# Patient Record
Sex: Female | Born: 1980 | Race: White | Hispanic: No | Marital: Single | Smoking: Never smoker
Health system: Southern US, Community
[De-identification: ages and names within clinical notes are randomized; demographics above are authoritative.]

## PROBLEM LIST (undated history)

## (undated) DIAGNOSIS — M545 Low back pain, unspecified: Secondary | ICD-10-CM

## (undated) DIAGNOSIS — Z8659 Personal history of other mental and behavioral disorders: Secondary | ICD-10-CM

## (undated) DIAGNOSIS — G4700 Insomnia, unspecified: Secondary | ICD-10-CM

## (undated) DIAGNOSIS — J45909 Unspecified asthma, uncomplicated: Secondary | ICD-10-CM

## (undated) DIAGNOSIS — Q2112 Patent foramen ovale: Secondary | ICD-10-CM

## (undated) DIAGNOSIS — R000 Tachycardia, unspecified: Secondary | ICD-10-CM

## (undated) DIAGNOSIS — J189 Pneumonia, unspecified organism: Secondary | ICD-10-CM

## (undated) DIAGNOSIS — G43909 Migraine, unspecified, not intractable, without status migrainosus: Secondary | ICD-10-CM

## (undated) DIAGNOSIS — N83209 Unspecified ovarian cyst, unspecified side: Secondary | ICD-10-CM

## (undated) DIAGNOSIS — E282 Polycystic ovarian syndrome: Secondary | ICD-10-CM

## (undated) DIAGNOSIS — Z8619 Personal history of other infectious and parasitic diseases: Secondary | ICD-10-CM

## (undated) DIAGNOSIS — E7800 Pure hypercholesterolemia, unspecified: Secondary | ICD-10-CM

## (undated) DIAGNOSIS — Q211 Atrial septal defect: Secondary | ICD-10-CM

## (undated) DIAGNOSIS — E559 Vitamin D deficiency, unspecified: Secondary | ICD-10-CM

## (undated) HISTORY — DX: Pneumonia, unspecified organism: J18.9

## (undated) HISTORY — DX: Low back pain: M54.5

## (undated) HISTORY — DX: Unspecified asthma, uncomplicated: J45.909

## (undated) HISTORY — DX: Insomnia, unspecified: G47.00

## (undated) HISTORY — DX: Vitamin D deficiency, unspecified: E55.9

## (undated) HISTORY — PX: WISDOM TOOTH EXTRACTION: SHX21

## (undated) HISTORY — DX: Low back pain, unspecified: M54.50

## (undated) HISTORY — DX: Pure hypercholesterolemia, unspecified: E78.00

## (undated) HISTORY — DX: Tachycardia, unspecified: R00.0

## (undated) HISTORY — DX: Personal history of other infectious and parasitic diseases: Z86.19

## (undated) HISTORY — PX: TONSILLECTOMY: SUR1361

## (undated) HISTORY — DX: Personal history of other mental and behavioral disorders: Z86.59

---
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
## 1999-11-12 ENCOUNTER — Other Ambulatory Visit: Admission: RE | Admit: 1999-11-12 | Discharge: 1999-11-12 | Payer: Self-pay | Admitting: Internal Medicine

## 2000-07-03 ENCOUNTER — Encounter: Payer: Self-pay | Admitting: Internal Medicine

## 2000-07-03 ENCOUNTER — Ambulatory Visit (HOSPITAL_COMMUNITY): Admission: RE | Admit: 2000-07-03 | Discharge: 2000-07-03 | Payer: Self-pay | Admitting: Internal Medicine

## 2005-03-21 ENCOUNTER — Emergency Department (HOSPITAL_COMMUNITY): Admission: EM | Admit: 2005-03-21 | Discharge: 2005-03-21 | Payer: Self-pay | Admitting: *Deleted

## 2005-05-31 DIAGNOSIS — J189 Pneumonia, unspecified organism: Secondary | ICD-10-CM

## 2005-05-31 HISTORY — DX: Pneumonia, unspecified organism: J18.9

## 2008-02-03 ENCOUNTER — Emergency Department (HOSPITAL_COMMUNITY): Admission: EM | Admit: 2008-02-03 | Discharge: 2008-02-03 | Payer: Self-pay | Admitting: Emergency Medicine

## 2009-01-05 ENCOUNTER — Inpatient Hospital Stay (HOSPITAL_COMMUNITY): Admission: EM | Admit: 2009-01-05 | Discharge: 2009-01-08 | Payer: Self-pay | Admitting: Emergency Medicine

## 2009-08-04 ENCOUNTER — Ambulatory Visit (HOSPITAL_COMMUNITY): Admission: RE | Admit: 2009-08-04 | Discharge: 2009-08-04 | Payer: Self-pay | Admitting: Cardiology

## 2009-08-04 ENCOUNTER — Encounter (INDEPENDENT_AMBULATORY_CARE_PROVIDER_SITE_OTHER): Payer: Self-pay | Admitting: Cardiology

## 2010-10-07 LAB — CBC
HCT: 33.8 % — ABNORMAL LOW (ref 36.0–46.0)
HCT: 39.1 % (ref 36.0–46.0)
Hemoglobin: 11.4 g/dL — ABNORMAL LOW (ref 12.0–15.0)
Hemoglobin: 13.1 g/dL (ref 12.0–15.0)
MCHC: 33.7 g/dL (ref 30.0–36.0)
MCHC: 33.9 g/dL (ref 30.0–36.0)
MCV: 88.1 fL (ref 78.0–100.0)
MCV: 88.6 fL (ref 78.0–100.0)
MCV: 88.6 fL (ref 78.0–100.0)
Platelets: 274 10*3/uL (ref 150–400)
Platelets: 279 10*3/uL (ref 150–400)
Platelets: 346 10*3/uL (ref 150–400)
RBC: 4.44 MIL/uL (ref 3.87–5.11)
RDW: 13.3 % (ref 11.5–15.5)
RDW: 13.4 % (ref 11.5–15.5)
WBC: 8.5 10*3/uL (ref 4.0–10.5)
WBC: 9.1 10*3/uL (ref 4.0–10.5)
WBC: 9.6 10*3/uL (ref 4.0–10.5)

## 2010-10-07 LAB — DRUGS OF ABUSE SCREEN W/O ALC, ROUTINE URINE
Amphetamine Screen, Ur: NEGATIVE
Cocaine Metabolites: NEGATIVE
Opiate Screen, Urine: NEGATIVE
Phencyclidine (PCP): NEGATIVE
Propoxyphene: NEGATIVE

## 2010-10-07 LAB — URINALYSIS, ROUTINE W REFLEX MICROSCOPIC
Bilirubin Urine: NEGATIVE
Glucose, UA: NEGATIVE mg/dL
Hgb urine dipstick: NEGATIVE
Ketones, ur: NEGATIVE mg/dL
Nitrite: NEGATIVE
Protein, ur: NEGATIVE mg/dL
Specific Gravity, Urine: 1.027 (ref 1.005–1.030)
Urobilinogen, UA: 0.2 mg/dL (ref 0.0–1.0)
pH: 6 (ref 5.0–8.0)

## 2010-10-07 LAB — BASIC METABOLIC PANEL
BUN: 10 mg/dL (ref 6–23)
BUN: 11 mg/dL (ref 6–23)
BUN: 11 mg/dL (ref 6–23)
BUN: 19 mg/dL (ref 6–23)
CO2: 24 mEq/L (ref 19–32)
Calcium: 8.7 mg/dL (ref 8.4–10.5)
Calcium: 8.8 mg/dL (ref 8.4–10.5)
Chloride: 110 mEq/L (ref 96–112)
Chloride: 111 mEq/L (ref 96–112)
Chloride: 111 mEq/L (ref 96–112)
Creatinine, Ser: 0.83 mg/dL (ref 0.4–1.2)
Creatinine, Ser: 0.85 mg/dL (ref 0.4–1.2)
Creatinine, Ser: 0.92 mg/dL (ref 0.4–1.2)
Creatinine, Ser: 1.1 mg/dL (ref 0.4–1.2)
GFR calc non Af Amer: >60 mL/min (ref >60)
Glucose, Bld: 94 mg/dL (ref 70–99)
Sodium: 139 mEq/L (ref 135–145)

## 2010-10-07 LAB — DIFFERENTIAL
Eosinophils Relative: 0 % (ref 0–5)
Lymphocytes Relative: 37 % (ref 12–46)
Lymphs Abs: 3.1 10*3/uL (ref 0.7–4.0)
Monocytes Absolute: 0.5 10*3/uL (ref 0.1–1.0)
Monocytes Relative: 7 % (ref 3–12)

## 2010-10-07 LAB — POCT I-STAT, CHEM 8
BUN: 20 mg/dL (ref 6–23)
Calcium, Ion: 1.17 mmol/L (ref 1.12–1.32)
Hemoglobin: 13.3 g/dL (ref 12.0–15.0)
TCO2: 25 mmol/L (ref 0–100)

## 2010-10-07 LAB — CORTISOL
Cortisol, Plasma: 13.2 ug/dL
Cortisol, Plasma: 3.7 ug/dL
Cortisol, Plasma: 6.6 ug/dL

## 2010-10-07 LAB — LIPID PANEL
HDL: 39 mg/dL — ABNORMAL LOW (ref >39)
Total CHOL/HDL Ratio: 4.9 RATIO

## 2010-10-07 LAB — D-DIMER, QUANTITATIVE: D-Dimer, Quant: 0.35 ug/mL-FEU (ref 0.00–0.48)

## 2010-10-07 LAB — CARDIAC PANEL(CRET KIN+CKTOT+MB+TROPI)
Relative Index: 0.4 (ref 0.0–2.5)
Total CK: 103 U/L (ref 7–177)
Troponin I: 0.01 ng/mL (ref 0.00–0.06)

## 2010-10-07 LAB — POCT CARDIAC MARKERS
CKMB, poc: <1 ng/mL — ABNORMAL LOW (ref 1.0–8.0)
Myoglobin, poc: 60 ng/mL (ref 12–200)
Troponin i, poc: <0.05 ng/mL (ref 0.00–0.09)

## 2010-10-07 LAB — TSH: TSH: 2.356 u[IU]/mL (ref 0.350–4.500)

## 2010-10-07 LAB — MAGNESIUM: Magnesium: 2.1 mg/dL (ref 1.5–2.5)

## 2010-10-07 LAB — POCT PREGNANCY, URINE: Preg Test, Ur: NEGATIVE

## 2010-11-13 NOTE — Discharge Summary (Signed)
Kathleen Montoya, COWING NO.:  [id number]   MEDICAL RECORD NO.:  [id number]          PATIENT TYPE:  OBV   LOCATION:  1439                         FACILITY:  [facility]   PHYSICIAN:  Ramiro Harvest, MD    DATE OF BIRTH:  1981/05/29   DATE OF ADMISSION:  01/04/2009  DATE OF DISCHARGE:  01/08/2009                               DISCHARGE SUMMARY   PRIMARY CARE PHYSICIAN:  Dr. Laurann Montoya of [facility].   DISCHARGE DIAGNOSES:  1. Hypotension.  2. Sinus tachycardia.  3. Dehydration.  4. Polycystic ovarian syndrome.  5. Migraine headaches.  6. History of depression.  7. History of low back pain.   DISCHARGE MEDICATIONS:  1. Wellbutrin 300 mg XL p.o. daily.  2. Metformin 500 mg p.o. daily.  3. Multivitamin 1 tablet p.o. daily.  4. Soma as previously taken.  5. Midrin as previously taken p.r.n. for headache.  6. Phenergan as previously taken p.r.n.  7. Zomig 5 mg p.o. p.r.n. headache.   DISPOSITION AND FOLLOW-UP:  Patient will be discharged home.  Patient is  to follow up with PCP in 1 week.  On followup, patient's blood pressure  needs to be reassessed.   CONSULTATIONS DONE:  None.   PROCEDURES PERFORMED:  1. A chest x-ray was done on January 04, 2009, which showed no acute      abnormalities.  2. An MRI of the head was done on January 07, 2009, which was negative.   BRIEF ADMISSION HISTORY AND PHYSICAL:  [name] is a 30-  year-old Caucasian female with no significant past medical history who  presented to the ED with complaints of palpitations that started early  on in the morning.  Patient had some occasional lightheadedness  associated with this but denied having any chest pain or shortness of  breath on exertion or any syncopal episodes.  Patient did not have any  similar episodes in the past.  Patient's orthostatics were checked and  she was reportedly normal according to the ED.  Patient was given 3 L  normal saline to replete her volume  status and even after that her heart  rate continued to be in the 130s therefore the physician's assistant  wanted the patient to be admitted given the uncertain etiology of her  sinus tachycardia.  Patient had been afebrile throughout and had no  other complaints.  Patient denied having any chest pain.  There was no  sharp chest pain with deep breaths either.  Patient had not had any  recent travel and was not on any birth control pills.  Patient had  stopped her birth control pills 2 months prior to admission.  Patient  denied any tobacco abuse.  Risk factors for DVT and PE were very low  probability.  D-dimer which was obtained was pending at the time of  admission.  EKG which was obtained revealed a sinus tachycardia without  any other abnormalities.  One set of cardiac enzymes done in the ED was  negative.   PHYSICAL EXAM PER ADMITTING PHYSICIAN:  Temperature of 99.1.  Pulse of  124 to 132.  Blood  pressure 105/54.  Saturating 100% on room air.  GENERAL:  Patient in no acute distress.  HEENT:  Normocephalic, atraumatic.  Pupils equal, round, and reactive to  light and accommodation.  Extraocular movements intact.  Oropharynx was  clear.  No lesions.  No exudates.  NECK:  Supple.  No lymphadenopathy.  Moist mucous membranes.  LUNGS:  Clear to auscultation bilaterally.  CARDIOVASCULAR:  Regular rate and rhythm.  No murmurs, rubs, or gallops.  ABDOMEN:  Soft, nontender, nondistended.  Positive bowel sounds.  No  hepatosplenomegaly.  EXTREMITIES:  No clubbing, cyanosis, or edema.  NEUROLOGIC:  Patient was alert and oriented x3.  Cranial nerves II-XII  are grossly intact and no focal deficits.   ADMISSION LABS:  CBC, white count of 9.6, hemoglobin of 13.3, hematocrit  39, platelets 346.  Her D-dimer was 0.35.  Sodium of 138, potassium 3.6,  chloride 103, bicarb 25, BUN 20, creatinine 1.3.  CK-MB was less than 1.  Troponin was less than 0.05.  Urine pregnancy test was negative.   Urinalysis was within normal limits.   HOSPITAL COURSE:  1. Hypotension.  During this hospitalization, patient was noted to be      hypotensive and initially felt to be likely secondary to volume      depletion.  Patient's blood pressure went as low as 88/51.  Patient      was hydrated with IV fluids with a slow improvement in her blood      pressure.  Patient's blood pressure remained during the      hospitalization in the low 90s and with IV fluids, as well as IV      boluses came up to the low 100s.  Patient had a few complaints of      some lightheadedness initially which resolved.  Cardiac enzymes      which were obtained were negative.  A TSH which was obtained was      within normal limits.  Patient's hemoglobin remained stable.      Patient did not have any signs of gross GI bleed and her last      hemoglobin was 12.2.  Patient had a normal white count, was      afebrile, did not have any signs of sepsis or infection.  Patient      was hydrated with IV fluids.  A random cortisol level was obtained      which came back at 6.6 which was on the low end of normal and as      such a cosyntropin stem test was done which was essentially      negative.  Patient was asymptomatic on the day prior to discharge      but during the hospitalization had some transient complaints of a      right-sided headache and some lightheadedness and fatigue and was      concerned of any neurological problems.  Patient was assured she      would not have any neurological deficits, however, was concerned      and had requested MRI to be done.  MRI was done which came back      negative with results as stated above.  On the day of discharge,      patient was asymptomatic and was in stable and improved condition.      Patient will be discharged home in stable and improved condition      with a close followup with her PCP.  On day  of discharge, vital      signs, temperature 98.5, pulse of 73, respirations of  14, she had a      blood pressure of 103/59, saturating 98% on room air.  Patient will      be discharged in stable condition with close followup with PCP as      an outpatient.  A D-dimer which was obtained during the      hospitalization was 0.35 which was within normal limits.  Patient      did not have any chest pain and her pretest probability for      pulmonary embolism was low.  2. Sinus tachycardia.  Patient was admitted with sinus tachycardia and      with heart rates in the 130s.  Patient was placed on telemetry tele      bed and hydrated with IV fluids as it was thought patient was      likely volume depleted .  Cardiac enzymes which were cycled were      negative.  A TSH which was obtained was within normal limits at      2.356.  Patient was hydrated with IV fluids with resolution of her      sinus tachycardia.  EKG which was done just showed a sinus      tachycardia with no ST-T wave abnormalities.  Patient will be      discharged in stable and improved condition.  3. Dehydration.  On admission, patient was noted to be slightly      dehydrated with a creatinine of 1.3 and a urinalysis with a      specific gravity of 1.027.  Patient was adequately hydrated with IV      fluids and her dehydration was resolved by day of discharge.      Patient will be discharged in stable and improved condition.   The rest of patient's chronic medical issues were stable throughout the  hospitalization and patient will be discharged in stable condition.  On  day of discharge, vital signs, temperature 98.5, pulse of 73, blood  pressure of 103/59, respirations 14, saturating 98% on room air.   DISCHARGE LABS:  Sodium 139, potassium 3.9, chloride 112, bicarb 24, BUN  11, creatinine 0.83, and glucose of 94 with a calcium of 8.8.   It was a pleasure taking care of [name].      Ramiro Harvest, MD  Electronically Signed     DT/MEDQ  D:  01/08/2009  T:  01/08/2009  Job:   [id number]   cc:   Stacie Acres. Cliffton Asters, M.D.  Fax: [phone]

## 2010-11-13 NOTE — H&P (Signed)
NAMECRUCITA, Kathleen Montoya           ACCOUNT NO.:  [id number]   MEDICAL RECORD NO.:  [id number]          PATIENT TYPE:  EMS   LOCATION:  ED                           FACILITY:  [facility]   PHYSICIAN:  Lucile Crater, MD         DATE OF BIRTH:  1981/02/06   DATE OF ADMISSION:  01/04/2009  DATE OF DISCHARGE:                              HISTORY & PHYSICAL   PRIMARY CARE PHYSICIAN:  Stacie Acres. White, M.D.   CHIEF COMPLAINT:  Palpitations.   HISTORY OF PRESENT ILLNESS:  Patient is a 30 year old Caucasian female  with no significant past medical history.  She comes to the ER  complaining of palpitations which started earlier this morning.  Patient  had occasional lightheadedness associated with this but denied having  any chest pain or dyspnea on exertion or any syncopal episodes.  The  patient did not have similar episodes in the past.  The patient's  orthostatics were checked, and they were reportedly normal, according to  the ER.  The patient was given 3 liters of normal saline to repeat her  volume status, and even after that, her heart rate continued to be in  the 130's.  Therefore, the PA wanted the patient to be admitted, given  the uncertain etiology of the sinus tachycardia.  The patient has been  afebrile throughout and has no other complaints.  She denies having any  chest pain.  There is no sharp chest pain with deep breaths.  She did  not travel anywhere recently.  She has not taken any birth control pills  currently.  She stopped them 2 months ago.  She does not smoke.  Her  risk factors for DVT and pulmonary embolus is very less.  A D-dimer was  ordered.  Results are not back yet.  An EKG was obtained in the  emergency room, which revealed a sinus tachycardia without any other  abnormalities.  One set of cardiac enzymes was negative.   REVIEW OF SYSTEMS:  A complete review of systems was done, which  included general, HEENT, cardiovascular, respiratory, GI, GU, endocrine,  musculoskeletal, skin, neurologic, psychiatric, and all were within  normal limits except for what was mentioned above.   PAST MEDICAL HISTORY:  1. Polycystic ovarian syndrome.  2. Migraine headaches.  3. Low back pain.   ALLERGIES:  1. HYDROCODONE, which gives her a rash.  2. ERYTHROMYCIN, which gives her a rash.   SOCIAL HISTORY:  There is no history of smoking or illicit drug use.  She occasionally drinks alcohol.  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   FAMILY HISTORY:  Cancer, diabetes, and CVA.   PHYSICAL EXAMINATION:  VITALS:  Pulse rate 124 to 132, blood pressure  105/54.  Temp 99.1, pulse ox 100% on room air.  GENERAL:  Patient is not in any acute distress.  Alert and oriented x3.  Afebrile.  HEENT:  Normocephalic and atraumatic.  Pupils are equal, round and  reactive to light and accommodation.  Extraocular movements are intact.  Mucous membranes are moist.  NECK:  Supple.  No JVD.  No lymphadenopathy.  CARDIOVASCULAR:  Regular rate and rhythm.  Rate is normal.  No murmurs,  rubs or gallops.  LUNGS:  Clear to auscultation bilaterally.  ABDOMEN:  Soft, nondistended, nontender.  No hepatosplenomegaly or  palpable masses.  EXTREMITIES:  No clubbing, cyanosis or edema.  NEUROLOGIC:  Grossly nonfocal.   LABS/STUDIES:  WBC 9600, hemoglobin 13.3, hematocrit 39, platelets 346.  Normal differential.  D-dimer 0.35.  Sodium 138, potassium 3.6, chloride  103, bicarb 25, BUN 20, creatinine 1.3.  CK-MB less than 1.  Troponin  less than 0.05.  Urine pregnancy test is negative.  Urinalysis is within  normal limits.   ASSESSMENT/PLAN:  1. Sinus tachycardia, no obvious etiology at this time.  Multiple      possibilities, including hyperthyroidism, hypovolemia, anemia.      Will check a TSH on the patient.  Will replete her volume.  She      received 2 units so far.  Will continue normal saline at a rate of      125 ml/hr.  Will place her on telemetry to see  if there is any      aberrance in her rhythm.  2. Polycystic ovarian syndrome.  Her creatinine is 1.3.  Will hold off      on Metformin until there is an improvement in serum creatinine.  3. Migraine headaches:  Currently asymptomatic.  Will start her on      medications as needed.  4. Deep venous thrombosis prophylaxis with subcutaneous heparin.  5. Fluids, electrolytes, nutrition:  Will replenish her volume status.      Will replace electrolytes as needed.  Will start her on a regular      diet.   DISPOSITION:  Will admit patient for 23-hour observation.      Lucile Crater, MD  Electronically Signed     TA/MEDQ  D:  01/05/2009  T:  01/05/2009  Job:  [id number]

## 2011-04-30 ENCOUNTER — Ambulatory Visit (HOSPITAL_COMMUNITY)
Admission: RE | Admit: 2011-04-30 | Discharge: 2011-04-30 | Disposition: A | Discharge status: Home / Self Care | Payer: 59 | Source: Ambulatory Visit | Attending: Gastroenterology | Admitting: Gastroenterology

## 2011-04-30 DIAGNOSIS — Z79899 Other long term (current) drug therapy: Secondary | ICD-10-CM | POA: Insufficient documentation

## 2011-04-30 DIAGNOSIS — G4700 Insomnia, unspecified: Secondary | ICD-10-CM | POA: Insufficient documentation

## 2011-04-30 DIAGNOSIS — G43909 Migraine, unspecified, not intractable, without status migrainosus: Secondary | ICD-10-CM | POA: Insufficient documentation

## 2011-04-30 DIAGNOSIS — R1310 Dysphagia, unspecified: Secondary | ICD-10-CM | POA: Insufficient documentation

## 2011-04-30 DIAGNOSIS — J45909 Unspecified asthma, uncomplicated: Secondary | ICD-10-CM | POA: Insufficient documentation

## 2011-04-30 DIAGNOSIS — Q211 Atrial septal defect: Secondary | ICD-10-CM | POA: Insufficient documentation

## 2011-04-30 DIAGNOSIS — K219 Gastro-esophageal reflux disease without esophagitis: Secondary | ICD-10-CM | POA: Insufficient documentation

## 2011-04-30 DIAGNOSIS — E282 Polycystic ovarian syndrome: Secondary | ICD-10-CM | POA: Insufficient documentation

## 2011-04-30 DIAGNOSIS — E7800 Pure hypercholesterolemia, unspecified: Secondary | ICD-10-CM | POA: Insufficient documentation

## 2011-04-30 DIAGNOSIS — E559 Vitamin D deficiency, unspecified: Secondary | ICD-10-CM | POA: Insufficient documentation

## 2011-04-30 DIAGNOSIS — Q2111 Secundum atrial septal defect: Secondary | ICD-10-CM | POA: Insufficient documentation

## 2011-04-30 DIAGNOSIS — Z7982 Long term (current) use of aspirin: Secondary | ICD-10-CM | POA: Insufficient documentation

## 2011-06-29 ENCOUNTER — Ambulatory Visit: Payer: 59

## 2011-06-29 DIAGNOSIS — G43009 Migraine without aura, not intractable, without status migrainosus: Secondary | ICD-10-CM

## 2012-04-03 ENCOUNTER — Encounter: Payer: 59 | Admitting: Physician Assistant

## 2012-04-03 NOTE — Progress Notes (Signed)
This encounter was created in error - please disregard.

## 2013-01-31 ENCOUNTER — Emergency Department (HOSPITAL_BASED_OUTPATIENT_CLINIC_OR_DEPARTMENT_OTHER): Payer: 59

## 2013-01-31 ENCOUNTER — Emergency Department (HOSPITAL_BASED_OUTPATIENT_CLINIC_OR_DEPARTMENT_OTHER)
Admission: EM | Admit: 2013-01-31 | Discharge: 2013-01-31 | Disposition: A | Discharge status: Home / Self Care | Payer: 59 | Attending: Emergency Medicine | Admitting: Emergency Medicine

## 2013-01-31 ENCOUNTER — Encounter (HOSPITAL_BASED_OUTPATIENT_CLINIC_OR_DEPARTMENT_OTHER): Payer: Self-pay | Admitting: *Deleted

## 2013-01-31 DIAGNOSIS — Z8639 Personal history of other endocrine, nutritional and metabolic disease: Secondary | ICD-10-CM | POA: Insufficient documentation

## 2013-01-31 DIAGNOSIS — R110 Nausea: Secondary | ICD-10-CM | POA: Insufficient documentation

## 2013-01-31 DIAGNOSIS — Z79899 Other long term (current) drug therapy: Secondary | ICD-10-CM | POA: Insufficient documentation

## 2013-01-31 DIAGNOSIS — Z862 Personal history of diseases of the blood and blood-forming organs and certain disorders involving the immune mechanism: Secondary | ICD-10-CM | POA: Insufficient documentation

## 2013-01-31 DIAGNOSIS — Z3202 Encounter for pregnancy test, result negative: Secondary | ICD-10-CM | POA: Insufficient documentation

## 2013-01-31 DIAGNOSIS — Z87798 Personal history of other (corrected) congenital malformations: Secondary | ICD-10-CM | POA: Insufficient documentation

## 2013-01-31 DIAGNOSIS — Z8742 Personal history of other diseases of the female genital tract: Secondary | ICD-10-CM | POA: Insufficient documentation

## 2013-01-31 DIAGNOSIS — G43909 Migraine, unspecified, not intractable, without status migrainosus: Secondary | ICD-10-CM | POA: Insufficient documentation

## 2013-01-31 DIAGNOSIS — R109 Unspecified abdominal pain: Secondary | ICD-10-CM | POA: Insufficient documentation

## 2013-01-31 HISTORY — DX: Patent foramen ovale: Q21.12

## 2013-01-31 HISTORY — DX: Migraine, unspecified, not intractable, without status migrainosus: G43.909

## 2013-01-31 HISTORY — DX: Atrial septal defect: Q21.1

## 2013-01-31 HISTORY — DX: Unspecified ovarian cyst, unspecified side: N83.209

## 2013-01-31 HISTORY — DX: Polycystic ovarian syndrome: E28.2

## 2013-01-31 LAB — CBC WITH DIFFERENTIAL/PLATELET
Eosinophils Absolute: 0.1 10*3/uL (ref 0.0–0.7)
Eosinophils Relative: 1 % (ref 0–5)
HCT: 40 % (ref 36.0–46.0)
Hemoglobin: 13.3 g/dL (ref 12.0–15.0)
Lymphocytes Relative: 30 % (ref 12–46)
Lymphs Abs: 2.4 10*3/uL (ref 0.7–4.0)
MCH: 29 pg (ref 26.0–34.0)
MCV: 87.3 fL (ref 78.0–100.0)
Monocytes Absolute: 0.5 10*3/uL (ref 0.1–1.0)
Monocytes Relative: 6 % (ref 3–12)
RBC: 4.58 MIL/uL (ref 3.87–5.11)
WBC: 7.8 10*3/uL (ref 4.0–10.5)

## 2013-01-31 LAB — COMPREHENSIVE METABOLIC PANEL
ALT: 19 U/L (ref 0–35)
Alkaline Phosphatase: 74 U/L (ref 39–117)
BUN: 13 mg/dL (ref 6–23)
CO2: 26 mEq/L (ref 19–32)
Calcium: 9.8 mg/dL (ref 8.4–10.5)
GFR calc Af Amer: 85 mL/min — ABNORMAL LOW (ref >90)
GFR calc non Af Amer: 74 mL/min — ABNORMAL LOW (ref >90)
Glucose, Bld: 89 mg/dL (ref 70–99)
Total Protein: 7.8 g/dL (ref 6.0–8.3)

## 2013-01-31 LAB — URINALYSIS, ROUTINE W REFLEX MICROSCOPIC
Ketones, ur: NEGATIVE mg/dL
Nitrite: NEGATIVE
Protein, ur: NEGATIVE mg/dL

## 2013-01-31 LAB — LIPASE, BLOOD: Lipase: 43 U/L (ref 11–59)

## 2013-01-31 LAB — PREGNANCY, URINE: Preg Test, Ur: NEGATIVE

## 2013-01-31 MED ORDER — SUCRALFATE 1 G PO TABS: 1.0000 g | ORAL_TABLET | Freq: Four times a day (QID) | ORAL | Status: DC

## 2013-01-31 MED ORDER — IOHEXOL 300 MG/ML  SOLN
50.0000 mL | Freq: Once | INTRAMUSCULAR | Status: AC | PRN
  Administered 2013-01-31: 50 mL via ORAL

## 2013-01-31 MED ORDER — IOHEXOL 300 MG/ML  SOLN
100.0000 mL | Freq: Once | INTRAMUSCULAR | Status: AC | PRN
  Administered 2013-01-31: 100 mL via INTRAVENOUS

## 2013-01-31 MED ORDER — HYDROCODONE-ACETAMINOPHEN 5-325 MG PO TABS: 1.0000 | ORAL_TABLET | Freq: Three times a day (TID) | ORAL | Status: DC | PRN

## 2013-01-31 MED ORDER — ONDANSETRON HCL 4 MG/2ML IJ SOLN
4.0000 mg | Freq: Once | INTRAMUSCULAR | Status: AC
  Administered 2013-01-31: 4 mg via INTRAVENOUS
  Filled 2013-01-31: qty 2

## 2013-01-31 MED ORDER — SODIUM CHLORIDE 0.9 % IV SOLN
1000.0000 mL | Freq: Once | INTRAVENOUS | Status: AC
  Administered 2013-01-31: 1000 mL via INTRAVENOUS

## 2013-01-31 NOTE — ED Notes (Signed)
Pt got refer here from [facility] walk-in clinic for evaluation of possible gallbladder or pancreas abnormalities. C/o upper abd pain since Wednesday; radiates to the shoulders. ABC intact, denied dysuria, denied any abnormality with bowel movement. Bowel sound present, non-tender on palpation. Thoughts coherent.

## 2013-01-31 NOTE — ED Notes (Signed)
Pt reports upper midline, right abdominal pain that started on Wednesday. (Pt was sent here from [facility])

## 2013-01-31 NOTE — ED Provider Notes (Signed)
CSN: [id number]     Arrival date & time 01/31/13  1539 History  This chart was scribed for Kathleen Munch, MD by Ardelia Mems, ED Scribe. This patient was seen in room MH07/MH07 and the patient's care was started at 4:11 PM.   First MD Initiated Contact with Patient 01/31/13 1611     Chief Complaint  Patient presents with  . Abdominal Pain    The history is provided by the patient. No language interpreter was used.   HPI Comments: Kathleen Montoya is a 32 y.o. Female with a history of polycystic ovarian disease and patent foramen ovale who presents to the Emergency Department complaining of gradual onset, gradually worsening, constant, moderate epigastric and upper abdominal pain that radiates to her shoulder blades onset 4 days ago. She states that she was sent here by her PCP, Dr. Laurann Montana with [facility] today. Pt states that she has a mass near her umbilicus that she believes and her PCP suspects may be an umbilical hernia, however she wants to rule out gallbladder and pancreas abnormalities. She states that her abdominal pain is worsened with eating and with lying supine. She reports associated nausea, which she believes is due to the fact that she hasn't eaten today. She denies history of similar abdominal pain. She denies vomiting, diarrhea, constipation, fever, chills, chest pain, SOB, confusion, weakness, syncope, dysuria or any other symptoms. She denies any history of smoking and is an occasional alcohol user.  PCP- Dr. Laurann Montana  Past Medical History  Diagnosis Date  . Ovarian cyst   . Migraines   . Patent foramen ovale   . Polycystic ovarian disease    Past Surgical History  Procedure Laterality Date  . Tonsillectomy    . Wisdom tooth extraction     History reviewed. No pertinent family history. History  Substance Use Topics  . Smoking status: Never Smoker   . Smokeless tobacco: Not on file  . Alcohol Use: Yes   OB History   Grav Para Term  Preterm Abortions TAB SAB Ect Mult Living                 Review of Systems  Constitutional: Negative for fever and chills.       Per HPI, otherwise negative  HENT:       Per HPI, otherwise negative  Respiratory: Negative for shortness of breath.        Per HPI, otherwise negative  Cardiovascular: Negative for chest pain.       Per HPI, otherwise negative  Gastrointestinal: Positive for nausea and abdominal pain. Negative for vomiting, diarrhea and constipation.  Endocrine:       Negative aside from HPI  Genitourinary: Negative for dysuria.       Neg aside from HPI   Musculoskeletal:       Per HPI, otherwise negative  Skin: Negative.   Neurological: Negative for syncope and weakness.  Psychiatric/Behavioral: Negative for confusion.  All other systems reviewed and are negative.    Allergies  Other  Home Medications   Current Outpatient Rx  Name  Route  Sig  Dispense  Refill  . cholecalciferol (VITAMIN D) 1000 UNITS tablet   Oral   Take 1,000 Units by mouth daily.         . cyclobenzaprine (FLEXERIL) 5 MG tablet   Oral   Take 5 mg by mouth 3 (three) times daily as needed for muscle spasms.         Marland Kitchen  HYDROcodone-acetaminophen (NORCO/VICODIN) 5-325 MG per tablet   Oral   Take 1 tablet by mouth every 6 (six) hours as needed for pain.         . metFORMIN (GLUCOPHAGE) 500 MG tablet   Oral   Take 500 mg by mouth daily with breakfast.         . metoprolol tartrate (LOPRESSOR) 25 MG tablet   Oral   Take 25 mg by mouth 2 (two) times daily.         . Multiple Vitamins-Minerals (MULTIVITAMIN WITH MINERALS) tablet   Oral   Take 1 tablet by mouth daily.         Marland Kitchen omeprazole (PRILOSEC) 10 MG capsule   Oral   Take 10 mg by mouth daily.         . promethazine (PHENERGAN) 25 MG tablet   Oral   Take 25 mg by mouth every 6 (six) hours as needed for nausea.         . SUMAtriptan (IMITREX) 6 MG/0.5ML SOLN injection   Subcutaneous   Inject 6 mg into the  skin every 2 (two) hours as needed for migraine or headache. F          Triage Vitals: BP 116/80  Pulse 95  Temp(Src) 98.4 F (36.9 C) (Oral)  Resp 18  Ht 5' 9.5" (1.765 m)  Wt 310 lb (140.615 kg)  BMI 45.14 kg/m2  SpO2 99%  Physical Exam  Nursing note and vitals reviewed. Constitutional: She is oriented to person, place, and time. She appears well-developed and well-nourished. No distress.  HENT:  Head: Normocephalic and atraumatic.  Eyes: Conjunctivae and EOM are normal.  Cardiovascular: Normal rate and regular rhythm.   Pulmonary/Chest: Effort normal and breath sounds normal. No stridor. No respiratory distress.  Abdominal: She exhibits no distension.  Mild tenderness to palpation in epigastric area. No tenderness to palpation in lower abdomen. No Murphy's sign.  Musculoskeletal: She exhibits no edema.  Neurological: She is alert and oriented to person, place, and time. No cranial nerve deficit.  Skin: Skin is warm and dry.  Psychiatric: She has a normal mood and affect.    ED Course   Medications  0.9 %  sodium chloride infusion (1,000 mLs Intravenous New Bag/Given 01/31/13 1654)  ondansetron (ZOFRAN) injection 4 mg (4 mg Intravenous Given 01/31/13 1654)  iohexol (OMNIPAQUE) 300 MG/ML solution 50 mL (50 mLs Oral Contrast Given 01/31/13 1739)  iohexol (OMNIPAQUE) 300 MG/ML solution 100 mL (100 mLs Intravenous Contrast Given 01/31/13 1740)   Procedures (including critical care time)  DIAGNOSTIC STUDIES: Oxygen Saturation is 99% on RA, normal by my interpretation.    COORDINATION OF CARE: 4:38 PM- Pt advised of plan for diagnostic lab work and radiology, along with plan for symptomatic relief with IV fluids and Zofran in the ED and pt agrees.  Labs Reviewed  COMPREHENSIVE METABOLIC PANEL - Abnormal; Notable for the following:    GFR calc non Af Amer 74 (*)    GFR calc Af Amer 85 (*)    All other components within normal limits  PREGNANCY, URINE  URINALYSIS, ROUTINE W  REFLEX MICROSCOPIC  CBC WITH DIFFERENTIAL  LIPASE, BLOOD   No results found.  No diagnosis found.   6:31 PM Patient ambulatory, in no distress MDM   I personally performed the services described in this documentation, which was scribed in my presence. The recorded information has been reviewed and is accurate.   Patient presents with concern of abdominal pain.  So the patient has a history of GERD, she now describes pain about the superior epigastrium, right upper quadrant, raising concern for both entrapped hernia and cholecystitis.  Patient's lab x-rays, CT are largely reassuring.  The patient remained hemodynamically stable in no distress, ambulatory during her emergency department stay.  She was discharged in stable condition with instructions to follow up with her primary care physician   Kathleen Munch, MD 01/31/13 [phone]

## 2013-04-03 ENCOUNTER — Other Ambulatory Visit: Payer: Self-pay | Admitting: Neurology

## 2013-09-21 ENCOUNTER — Encounter: Payer: Self-pay | Admitting: Cardiology

## 2013-11-08 ENCOUNTER — Ambulatory Visit: Payer: 59 | Admitting: Cardiology

## 2013-11-12 ENCOUNTER — Ambulatory Visit: Payer: 59 | Admitting: Cardiology

## 2014-03-15 ENCOUNTER — Other Ambulatory Visit: Payer: Self-pay | Admitting: *Deleted

## 2014-03-15 ENCOUNTER — Telehealth: Payer: Self-pay | Admitting: *Deleted

## 2014-03-15 MED ORDER — METOPROLOL TARTRATE 25 MG PO TABS: 25.0000 mg | ORAL_TABLET | Freq: Two times a day (BID) | ORAL | Status: DC

## 2014-03-15 NOTE — Telephone Encounter (Signed)
Ok to refill metoprolol 25 mg 1 tab BID for 30 day supply. Pt needs appt asap!

## 2014-03-15 NOTE — Telephone Encounter (Signed)
[facility] requests metoprolol refill for patient. Thanks, MI

## 2014-04-20 ENCOUNTER — Other Ambulatory Visit: Payer: Self-pay | Admitting: *Deleted

## 2014-04-20 MED ORDER — METOPROLOL TARTRATE 25 MG PO TABS: 25.0000 mg | ORAL_TABLET | Freq: Two times a day (BID) | ORAL | Status: DC

## 2014-06-01 ENCOUNTER — Encounter: Payer: Self-pay | Admitting: *Deleted

## 2014-06-03 ENCOUNTER — Ambulatory Visit (INDEPENDENT_AMBULATORY_CARE_PROVIDER_SITE_OTHER): Payer: 59 | Admitting: Cardiology

## 2014-06-03 ENCOUNTER — Encounter: Payer: Self-pay | Admitting: Cardiology

## 2014-06-03 VITALS — BP 120/60 | HR 73 | Ht 69.0 in | Wt 320.4 lb

## 2014-06-03 DIAGNOSIS — R002 Palpitations: Secondary | ICD-10-CM

## 2014-06-03 DIAGNOSIS — R0789 Other chest pain: Secondary | ICD-10-CM

## 2014-06-03 NOTE — Patient Instructions (Signed)
The current medical regimen is effective;  continue present plan and medications.  Follow up in 6 months with Dr. Skains.  You will receive a letter in the mail 2 months before you are due.  Please call us when you receive this letter to schedule your follow up appointment.  

## 2014-06-03 NOTE — Progress Notes (Signed)
[address] [place], [place]  [address] Phone: [phone] Fax:  [phone]  Date:  06/03/2014   ID:  Kathleen RoverChristine A Montoya, DOB 01/12/1981, MRN [id number]  PCP:  Cala BradfordWHITE,Kathleen S, MD   History of Present Illness: Kathleen RoverChristine A Montoya is a 33 y.o. female here for annual follow-up after echocardiogram in 2011 demonstrated 45-50% EF. Prior Holter monitor showed periodic evidence of sinus tachycardia. Bubble study was early positive with several bubbles crossing within 1-2 beats. Generous right atrium as well as right ventricle. TEE performed in February 2011 showed PFO, trivial right to left shunt, no evidence of large ASD.   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 She has felt some atypical left-sided chest discomfort left of sternum, sharp at times, continuous, nonexertional. No radiation. This concerns her sometimes and she takes her blood pressure. She states that she has had a very stressful year.    Wt Readings from Last 3 Encounters:  06/03/14 320 lb 6.4 oz (145.332 kg)  01/31/13 310 lb (140.615 kg)     Past Medical History  Diagnosis Date  . Ovarian cyst   . Migraines   . Patent foramen ovale   . Polycystic ovarian disease     Past Surgical History  Procedure Laterality Date  . Tonsillectomy    . Wisdom tooth extraction      Current Outpatient Prescriptions  Medication Sig Dispense Refill  . cholecalciferol (VITAMIN D) 1000 UNITS tablet Take 6,000 Units by mouth daily. 3 TABS QD    . metFORMIN (GLUCOPHAGE) 500 MG tablet Take 500 mg by mouth daily with breakfast. TAKEN BID    . metoprolol tartrate (LOPRESSOR) 25 MG tablet Take 1 tablet (25 mg total) by mouth 2 (two) times daily. 60 tablet 0  . Multiple Vitamins-Minerals (MULTIVITAMIN WITH MINERALS) tablet Take 1 tablet by mouth daily.    . naratriptan (AMERGE) 2.5 MG tablet as needed.  0  . omeprazole (PRILOSEC) 10 MG capsule Take 10 mg by mouth daily.    . promethazine (PHENERGAN) 25 MG tablet  Take 25 mg by mouth every 6 (six) hours as needed for nausea.     No current facility-administered medications for this visit.    Allergies:    Allergies  Allergen Reactions  . Other Hives    loestrin-    Social History:  The patient  reports that she has never smoked. She does not have any smokeless tobacco history on file. She reports that she drinks alcohol. She reports that she does not use illicit drugs.   No family history on file.  ROS:  Please see the history of present illness.   Denies any fevers, chills, orthopnea, PND, syncope   All other systems reviewed and negative.   PHYSICAL EXAM: VS:  BP 120/60 mmHg  Pulse 73  Ht 5\' 9"  (1.753 m)  Wt 320 lb 6.4 oz (145.332 kg)  BMI 47.29 kg/m2 Well nourished, well developed, in no acute distress HEENT: normal, [place]/AT, EOMI Neck: no JVD, normal carotid upstroke, no bruit Cardiac:  normal S1, S2; RRR; no murmur Lungs:  clear to auscultation bilaterally, no wheezing, rhonchi or rales Abd: soft, nontender, no hepatomegaly, no bruits Ext: no edema, 2+ distal pulses Skin: warm and dry GU: deferred Neuro: no focal abnormalities noted, AAO x 3  EKG:  06/03/14-sinus rhythm, 74, nonspecific T-wave changes. Rightward axis.     ASSESSMENT AND PLAN:  1. Mildly decreased left ventricular systolic function - continuing  with current metoprolol. 2. Palpitations-continue metoprolol. 3. Atypical chest pain-likely musculoskeletal. Pinpoint, fairly continuous at times. Nonexertional. Prior exercise treadmill test reassuring. We will monitor. 4. Morbid obesity-continue with weight loss. She has had a stressful year. Carbohydrates. 5. 6 month follow up    Signed, Donato SchultzMark Aniella Wandrey, MD [facility]  06/03/2014 11:27 AM

## 2014-11-23 ENCOUNTER — Other Ambulatory Visit: Payer: Self-pay

## 2014-11-23 MED ORDER — METOPROLOL TARTRATE 25 MG PO TABS: 25.0000 mg | ORAL_TABLET | Freq: Two times a day (BID) | ORAL | Status: DC

## 2014-11-23 NOTE — Telephone Encounter (Signed)
Per note 12.4.15 

## 2015-09-22 ENCOUNTER — Other Ambulatory Visit: Payer: Self-pay | Admitting: Cardiology

## 2015-12-08 ENCOUNTER — Other Ambulatory Visit: Payer: Self-pay | Admitting: Cardiology

## 2016-04-16 ENCOUNTER — Telehealth: Payer: Self-pay | Admitting: Cardiology

## 2016-04-16 ENCOUNTER — Other Ambulatory Visit: Payer: Self-pay | Admitting: *Deleted

## 2016-04-16 MED ORDER — METOPROLOL TARTRATE 25 MG PO TABS: 25.0000 mg | ORAL_TABLET | Freq: Two times a day (BID) | ORAL | 0 refills | Status: DC

## 2016-04-16 NOTE — Telephone Encounter (Signed)
Thank you.  If the patient needs another refill prior to her appt she may either schedule in November as there are open appts or obtain from PCP.

## 2016-04-16 NOTE — Telephone Encounter (Signed)
Patient has not been seen since 2015 but has scheduled an appointment for 07/12/16. Okay to refill until then or does the patient need to be seen sooner by an extender? Please advise. Thanks, MI

## 2016-04-16 NOTE — Telephone Encounter (Signed)
We have appts available prior to January and she should be scheduled for one of those.  OK to fill X 1 - no refills.

## 2016-04-16 NOTE — Telephone Encounter (Signed)
New Message   *STAT* If patient is at the pharmacy, call can be transferred to refill team.   1. Which medications need to be refilled? (please list name of each medication and dose if known) metoprolol tartrate 25 mg tablet twice daily totaling 25 mg total  2. Which pharmacy/location (including street and city if local pharmacy) is medication to be sent to? [id number], [address], [place], [place]  3. Do they need a 30 day or 90 day supply? 90 day supply

## 2016-04-16 NOTE — Telephone Encounter (Signed)
I have spoke with the patient and made her aware that the medication could only be authorized for one thirty day supply and she would need to schedule a sooner appointment. She stated that she would just have to go without the medication then as she is not able to come in until January.

## 2016-07-12 ENCOUNTER — Encounter (INDEPENDENT_AMBULATORY_CARE_PROVIDER_SITE_OTHER): Payer: Self-pay

## 2016-07-12 ENCOUNTER — Encounter: Payer: Self-pay | Admitting: Cardiology

## 2016-07-12 ENCOUNTER — Ambulatory Visit (INDEPENDENT_AMBULATORY_CARE_PROVIDER_SITE_OTHER): Payer: 59 | Admitting: Cardiology

## 2016-07-12 VITALS — BP 108/70 | HR 83 | Ht 69.0 in | Wt 328.0 lb

## 2016-07-12 DIAGNOSIS — I493 Ventricular premature depolarization: Secondary | ICD-10-CM

## 2016-07-12 DIAGNOSIS — R002 Palpitations: Secondary | ICD-10-CM

## 2016-07-12 MED ORDER — METOPROLOL SUCCINATE ER 50 MG PO TB24: 50.0000 mg | ORAL_TABLET | Freq: Every day | ORAL | 3 refills | Status: DC

## 2016-07-12 NOTE — Patient Instructions (Signed)
Medication Instructions:  Please stop your Metoprolol tartrate and start Metoprolol succinate 50 mg a day. Continue all other medications as listed.  Testing/Procedures: Your physician has requested that you have an echocardiogram. Echocardiography is a painless test that uses sound waves to create images of your heart. It provides your doctor with information about the size and shape of your heart and how well your heart's chambers and valves are working. This procedure takes approximately one hour. There are no restrictions for this procedure.  Follow-Up: Follow up in 6 months with Dr. Anne FuSkains.  You will receive a letter in the mail 2 months before you are due.  Please call us when you receive this letter to schedule your follow up appointment.  If you need a refill on your cardiac medications before your next appointment, please call your pharmacy.  Thank you for choosing [facility]!!

## 2016-07-12 NOTE — Progress Notes (Signed)
[address] [place], [place]  [address] Phone: [phone] Fax:  [phone]) [phone]  Date:  07/12/2016   ID:  Kathleen Montoya, DOB 06/27/1981, MRN [id number]  PCP:  Cala BradfordWHITE,CYNTHIA S, MD   History of Present Illness: Kathleen Montoya is Montoya 36 y.o. female here for annual follow-up after echocardiogram in 2011 demonstrated 45-50% EF. Prior Holter monitor showed periodic evidence of sinus tachycardia. Bubble study was early positive with several bubbles crossing within 1-2 beats. Generous right atrium as well as right ventricle. TEE performed in February 2011 showed PFO, trivial right to left shunt, no evidence of large ASD. She remembers seeing PVCs on monitor.  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  Occasionally she will feel increased PVCs/fluttering-like sensation, sensation of Montoya roller coaster stomach dropping out. No syncope. No chest pain. That has been quite some times and she has been back here, she lost her insurance at one point.   Wt Readings from Last 3 Encounters:  07/12/16 (!) 328 lb (148.8 kg)  06/03/14 (!) 320 lb 6.4 oz (145.3 kg)  01/31/13 (!) 310 lb (140.6 kg)     Past Medical History:  Diagnosis Date  . Migraines   . Ovarian cyst   . Patent foramen ovale   . Polycystic ovarian disease     Past Surgical History:  Procedure Laterality Date  . TONSILLECTOMY    . WISDOM TOOTH EXTRACTION      Current Outpatient Prescriptions  Medication Sig Dispense Refill  . cholecalciferol (VITAMIN D) 1000 UNITS tablet Take 6,000 Units by mouth daily. 3 TABS QD    . metFORMIN (GLUCOPHAGE) 500 MG tablet Take 500 mg by mouth daily with breakfast. TAKEN BID    . metoprolol succinate (TOPROL-XL) 50 MG 24 hr tablet Take 1 tablet (50 mg total) by mouth daily. Take with or immediately following Montoya meal. 90 tablet 3  . Multiple Vitamins-Minerals (MULTIVITAMIN WITH MINERALS) tablet Take 1 tablet by mouth daily.    . naratriptan  (AMERGE) 2.5 MG tablet as needed.  0  . omeprazole (PRILOSEC) 10 MG capsule Take 10 mg by mouth daily.    . promethazine (PHENERGAN) 25 MG tablet Take 25 mg by mouth every 6 (six) hours as needed for nausea.     No current facility-administered medications for this visit.     Allergies:    Allergies  Allergen Reactions  . Clindamycin Hives  . Other Hives    loestrin-    Social History:  The patient  reports that she has never smoked. She does not have any smokeless tobacco history on file. She reports that she drinks alcohol. She reports that she does not use drugs.   No family history on file.  ROS:  Please see the history of present illness.   Denies any fevers, chills, orthopnea, PND, syncope   All other systems reviewed and negative.   PHYSICAL EXAM: VS:  BP 108/70   Pulse 83   Ht 5\' 9"  (1.753 m)   Wt (!) 328 lb (148.8 kg)   LMP 06/11/2016 (LMP Unknown)   BMI 48.44 kg/m  Well nourished, well developed, in no acute distress HEENT: normal, [place]/AT, EOMI Neck: no JVD, normal carotid upstroke, no bruit Cardiac:  normal S1, S2; RRR; no murmur Lungs:  clear to auscultation bilaterally, no wheezing, rhonchi or rales Abd: soft, nontender, no hepatomegaly, no bruits Ext: no edema, 2+ distal pulses Skin: warm and dry GU: deferred Neuro:  no focal abnormalities noted, AAO x 3  EKG:  Today 07/12/16-sinus rhythm occasional PVCs heart rate 83 bpm, vertical axis, nonspecific ST-T wave changes. QT does not appear significantly prolonged. Personally viewed-prior 06/03/14-sinus rhythm, 74, nonspecific T-wave changes. Rightward axis.     ASSESSMENT AND PLAN:  1. Mildly decreased left ventricular systolic function - changing metoprolol to XL 50 mg once Montoya day. Prior EF mildly reduced. 2. Palpitations-continue metoprolol. As above. Sometimes they feel like Montoya fluttering sensation. She has to stop, sensation of catching breath. 3. Atypical chest pain-likely musculoskeletal. [place], fairly  continuous at times. Nonexertional. Prior exercise treadmill test reassuring. We will monitor. 4. Morbid obesity-continue with weight loss. She has had Montoya stressful year. Carbohydrates. 5. 6 month follow up    Signed, Donato Schultz, MD [facility]  07/12/2016 3:57 PM

## 2016-07-31 ENCOUNTER — Other Ambulatory Visit: Payer: Self-pay

## 2016-07-31 ENCOUNTER — Ambulatory Visit (HOSPITAL_COMMUNITY): Payer: 59 | Attending: Cardiovascular Disease

## 2016-07-31 DIAGNOSIS — Z6841 Body Mass Index (BMI) 40.0 and over, adult: Secondary | ICD-10-CM | POA: Insufficient documentation

## 2016-07-31 DIAGNOSIS — I429 Cardiomyopathy, unspecified: Secondary | ICD-10-CM | POA: Insufficient documentation

## 2016-07-31 DIAGNOSIS — R002 Palpitations: Secondary | ICD-10-CM

## 2016-07-31 DIAGNOSIS — G43909 Migraine, unspecified, not intractable, without status migrainosus: Secondary | ICD-10-CM | POA: Insufficient documentation

## 2016-07-31 DIAGNOSIS — E282 Polycystic ovarian syndrome: Secondary | ICD-10-CM | POA: Diagnosis not present

## 2016-07-31 DIAGNOSIS — E669 Obesity, unspecified: Secondary | ICD-10-CM | POA: Insufficient documentation

## 2016-07-31 DIAGNOSIS — I071 Rheumatic tricuspid insufficiency: Secondary | ICD-10-CM | POA: Diagnosis not present

## 2017-02-15 ENCOUNTER — Emergency Department (HOSPITAL_BASED_OUTPATIENT_CLINIC_OR_DEPARTMENT_OTHER): Admission: EM | Admit: 2017-02-15 | Discharge: 2017-02-15 | Discharge status: Against Medical Advice | Payer: 59

## 2017-02-15 NOTE — ED Triage Notes (Signed)
CN out to receive and triage pt. Pt sitting at registration desk. Pt changing her mind about staying and choosing to leave. Pt concerned about cost and insurance. Encouraged to stay. Verbalized to pt we would be glad to see her regardless. Verbalized to pt hospital would be glad to work with her. Encouraged her to return if she changed her mind. Pt walked out of building not wanting to continue conversation. Registration and CN present.   Alert, NAD, calm, interactive, resps e/u, speaking in clear complete sentences, no dyspnea noted, normal station and gait.

## 2017-02-23 DIAGNOSIS — Q2112 Patent foramen ovale: Secondary | ICD-10-CM | POA: Insufficient documentation

## 2017-02-23 DIAGNOSIS — E559 Vitamin D deficiency, unspecified: Secondary | ICD-10-CM | POA: Insufficient documentation

## 2017-02-23 DIAGNOSIS — K219 Gastro-esophageal reflux disease without esophagitis: Secondary | ICD-10-CM | POA: Diagnosis present

## 2017-02-23 DIAGNOSIS — E282 Polycystic ovarian syndrome: Secondary | ICD-10-CM | POA: Insufficient documentation

## 2017-02-23 DIAGNOSIS — G43829 Menstrual migraine, not intractable, without status migrainosus: Secondary | ICD-10-CM | POA: Insufficient documentation

## 2017-03-25 ENCOUNTER — Other Ambulatory Visit: Payer: Self-pay | Admitting: Physical Medicine and Rehabilitation

## 2017-03-25 DIAGNOSIS — M533 Sacrococcygeal disorders, not elsewhere classified: Secondary | ICD-10-CM

## 2017-04-04 ENCOUNTER — Other Ambulatory Visit: Payer: Self-pay | Admitting: Orthopedic Surgery

## 2017-04-09 DIAGNOSIS — Z87898 Personal history of other specified conditions: Secondary | ICD-10-CM | POA: Insufficient documentation

## 2017-04-22 ENCOUNTER — Ambulatory Visit
Admission: RE | Admit: 2017-04-22 | Discharge: 2017-04-22 | Disposition: A | Discharge status: Home / Self Care | Payer: 59 | Source: Ambulatory Visit | Attending: Physical Medicine and Rehabilitation | Admitting: Physical Medicine and Rehabilitation

## 2017-04-22 DIAGNOSIS — M533 Sacrococcygeal disorders, not elsewhere classified: Secondary | ICD-10-CM

## 2017-04-22 MED ORDER — IOPAMIDOL (ISOVUE-M 200) INJECTION 41%
1.0000 mL | Freq: Once | INTRAMUSCULAR | Status: AC
  Administered 2017-04-22: 1 mL via INTRA_ARTICULAR

## 2017-04-22 MED ORDER — METHYLPREDNISOLONE ACETATE 40 MG/ML INJ SUSP (RADIOLOG
120.0000 mg | Freq: Once | INTRAMUSCULAR | Status: AC
  Administered 2017-04-22: 120 mg via INTRA_ARTICULAR

## 2017-04-22 NOTE — Discharge Instructions (Signed)

## 2017-04-23 ENCOUNTER — Encounter: Payer: Self-pay | Admitting: Cardiology

## 2017-04-23 ENCOUNTER — Ambulatory Visit (INDEPENDENT_AMBULATORY_CARE_PROVIDER_SITE_OTHER): Payer: 59 | Admitting: Cardiology

## 2017-04-23 ENCOUNTER — Encounter (INDEPENDENT_AMBULATORY_CARE_PROVIDER_SITE_OTHER): Payer: Self-pay

## 2017-04-23 VITALS — BP 96/66 | HR 88 | Resp 16 | Ht 69.25 in | Wt 340.0 lb

## 2017-04-23 DIAGNOSIS — R002 Palpitations: Secondary | ICD-10-CM

## 2017-04-23 DIAGNOSIS — I493 Ventricular premature depolarization: Secondary | ICD-10-CM

## 2017-04-23 NOTE — Patient Instructions (Addendum)

## 2017-04-23 NOTE — Progress Notes (Signed)
[address] [place], [place]  [address] Phone: [phone] Fax:  [phone]  Date:  04/23/2017   ID:  Kathleen Montoya, DOB May 14, 1981, MRN [id number]  PCP:  Laurann Montana, MD   History of Present Illness: Kathleen Montoya is a 36 y.o. female here for annual follow-up after echocardiogram in 2011 demonstrated 45-50% EF. Prior Holter monitor showed periodic evidence of sinus tachycardia. Bubble study was early positive with several bubbles crossing within 1-2 beats. Generous right atrium as well as right ventricle. TEE performed in February 2011 showed PFO, trivial right to left shunt, no evidence of large ASD. She remembers seeing PVCs on monitor.  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   Occasionally she will feel increased PVCs/fluttering-like sensation, sensation of a roller coaster stomach dropping out. No syncope. No chest pain. That has been quite some times and she has been back here, she lost her insurance at one point.  04/23/17 - dizziness, ear infection. Smoother on Toprol. More when laying down. Si joint injection. Occasional SOB random.  Sometimes at the grocery store she will randomly feel short of breath.  Wt Readings from Last 3 Encounters:  04/23/17 (!) 340 lb (154.2 kg)  07/12/16 (!) 328 lb (148.8 kg)  06/03/14 (!) 320 lb 6.4 oz (145.3 kg)     Past Medical History:  Diagnosis Date  . Childhood asthma   . History of depression    In college  . History of HPV infection   . Hypercholesteremia   . Insomnia   . Low back pain   . Migraines   . Ovarian cyst   . Patent foramen ovale   . Pneumonia 05/2005  . Polycystic ovarian disease   . Tachycardia   . Vitamin D deficiency     Past Surgical History:  Procedure Laterality Date  . TONSILLECTOMY    . WISDOM TOOTH EXTRACTION      Current Outpatient Prescriptions  Medication Sig Dispense Refill  . cholecalciferol (VITAMIN D) 1000 UNITS  tablet Take 6,000 Units by mouth daily. 3 TABS QD    . Cholecalciferol (VITAMIN D3) 2000 units TABS Take 1-2 capsules by mouth daily.    . metFORMIN (GLUCOPHAGE-XR) 750 MG 24 hr tablet Take 750 mg by mouth daily with breakfast.    . metoprolol succinate (TOPROL-XL) 50 MG 24 hr tablet Take 1 tablet (50 mg total) by mouth daily. Take with or immediately following a meal. 90 tablet 3  . Multiple Vitamins-Minerals (MULTIVITAMIN WITH MINERALS) tablet Take 1 tablet by mouth daily.    . naratriptan (AMERGE) 2.5 MG tablet as needed.  0  . omeprazole (PRILOSEC) 10 MG capsule Take 10 mg by mouth daily.    . promethazine (PHENERGAN) 25 MG tablet Take 25 mg by mouth every 6 (six) hours as needed for nausea.     No current facility-administered medications for this visit.     Allergies:    Allergies  Allergen Reactions  . Clindamycin Hives  . Amitriptyline Other (See Comments)    Not effective: Lack of Therapeutic effect  . Clindamycin/Lincomycin Rash  . Erythromycin Other (See Comments)    Abdominal pain  . Loestrin [Norethindrone Acet-Ethinyl Est] Hives  . Maxalt [Rizatriptan Benzoate] Other (See Comments)    Not effective  . Norethin-Eth Estrad Biphasic Hives  . Nortriptyline Hcl Other (See Comments)    Not effective: Lack Therapeutic Effect  . Sumatriptan Other (See Comments)  Felt like acid was in her brain  . Zomig [Zolmitriptan] Other (See Comments)    Not effective     Social History:  The patient  reports that she has never smoked. She has never used smokeless tobacco. She reports that she drinks alcohol. She reports that she does not use drugs.   History reviewed. No pertinent family history.  ROS:  Please see the history of present illness.   Denies any fevers, chills, orthopnea, PND, syncope   All other systems reviewed and negative.   PHYSICAL EXAM: VS:  BP 96/66   Pulse 88   Resp 16   Ht 5' 9.25" (1.759 m)   Wt (!) 340 lb (154.2 kg)   LMP 04/14/2017   SpO2 98%   BMI  49.85 kg/m  GEN: Well nourished, well developed, in no acute distress obese HEENT: normal  Neck: no JVD, carotid bruits, or masses Cardiac: RRR; no murmurs, rubs, or gallops,no edema  Respiratory:  clear to auscultation bilaterally, normal work of breathing GI: soft, nontender, nondistended, + BS MS: no deformity or atrophy  Skin: warm and dry, no rash Neuro:  Alert and Oriented x 3, Strength and sensation are intact Psych: euthymic mood, full affect   EKG:   07/12/16-sinus rhythm occasional PVCs heart rate 83 bpm, vertical axis, nonspecific ST-T wave changes. QT does not appear significantly prolonged. Personally viewed-prior 06/03/14-sinus rhythm, 74, nonspecific T-wave changes. Rightward axis.     ASSESSMENT AND PLAN:  1. Mildly decreased left ventricular systolic function, 45-50%-Toprol XL 50 mg once a day. Prior EF mildly reduced. 2. Palpitations-continue metoprolol. As above. Sometimes they feel like a fluttering sensation. She has to stop, sensation of catching breath.  Since changing to the long-acting version of metoprolol, Toprol, she has been doing better. 3. Morbid obesity-continue with weight loss.  No changes made. 4. 12 month follow up  Signed, Donato SchultzMark Ellard Nan, MD [facility]  04/23/2017 4:42 PM

## 2017-05-27 ENCOUNTER — Other Ambulatory Visit: Payer: Self-pay | Admitting: Orthopaedic Surgery

## 2017-05-27 DIAGNOSIS — M533 Sacrococcygeal disorders, not elsewhere classified: Secondary | ICD-10-CM

## 2017-11-05 ENCOUNTER — Other Ambulatory Visit: Payer: Self-pay | Admitting: Cardiology

## 2018-05-14 ENCOUNTER — Encounter: Payer: Self-pay | Admitting: Cardiology

## 2018-06-03 ENCOUNTER — Ambulatory Visit (INDEPENDENT_AMBULATORY_CARE_PROVIDER_SITE_OTHER): Payer: No Typology Code available for payment source | Admitting: Cardiology

## 2018-06-03 ENCOUNTER — Encounter: Payer: Self-pay | Admitting: Cardiology

## 2018-06-03 VITALS — BP 106/70 | HR 97 | Ht 69.25 in | Wt 348.1 lb

## 2018-06-03 DIAGNOSIS — I493 Ventricular premature depolarization: Secondary | ICD-10-CM | POA: Diagnosis not present

## 2018-06-03 DIAGNOSIS — R002 Palpitations: Secondary | ICD-10-CM

## 2018-06-03 DIAGNOSIS — J45990 Exercise induced bronchospasm: Secondary | ICD-10-CM | POA: Diagnosis not present

## 2018-06-03 MED ORDER — ALBUTEROL SULFATE HFA 108 (90 BASE) MCG/ACT IN AERS: 1.0000 | INHALATION_SPRAY | RESPIRATORY_TRACT | 0 refills | Status: DC

## 2018-06-03 NOTE — Patient Instructions (Signed)
Medication Instructions:  May use albuterol inhaler 5 to 20 minutes prior to exercise. Continue all other medications as listed.  If you need a refill on your cardiac medications before your next appointment, please call your pharmacy.   Follow-Up: At [facility], you and your health needs are our priority.  As part of our continuing mission to provide you with exceptional heart care, we have created designated Provider Care Teams.  These Care Teams include your primary Cardiologist (physician) and Advanced Practice Providers (APPs -  Physician Assistants and Nurse Practitioners) who all work together to provide you with the care you need, when you need it. You will need a follow up appointment in 12 months.  Please call our office 2 months in advance to schedule this appointment.  You may see Donato SchultzMark Skains, MD or one of the following Advanced Practice Providers on your designated Care Team:   Norma FredricksonLori Gerhardt, NP Nada BoozerLaura Ingold, NP . Georgie ChardJill McDaniel, NP  Thank you for choosing [facility]!!

## 2018-06-03 NOTE — Progress Notes (Signed)
Cardiology Office Note:    Date:  06/03/2018   ID:  Kathleen Montoya, DOB 1981-01-26, MRN [id number]  PCP:  Laurann Montana, MD  Cardiologist:  Donato Schultz, MD  Electrophysiologist:  None   Referring MD: Laurann Montana, MD     History of Present Illness:    Kathleen Montoya is a 37 y.o. female here for follow-up of PVCs.  Toprol has helped.  Current EKG shows normal sinus rhythm 97 with T wave nonspecific abnormalities in V2 and V3.  She does have a history of exercise-induced asthma.  She is motivated now to try to exercise but she does wheeze at times.  She states that she does have a treadmill at home but it has been accumulating clothes.  She does admit that she has been eating more fast food recently.  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  Very rarely does she have palpitations.    Past Medical History:  Diagnosis Date  . Childhood asthma   . History of depression    In college  . History of HPV infection   . Hypercholesteremia   . Insomnia   . Low back pain   . Migraines   . Ovarian cyst   . Patent foramen ovale   . Pneumonia 05/2005  . Polycystic ovarian disease   . Tachycardia   . Vitamin D deficiency     Past Surgical History:  Procedure Laterality Date  . TONSILLECTOMY    . WISDOM TOOTH EXTRACTION      Current Medications: Current Meds  Medication Sig  . Cholecalciferol (VITAMIN D3) 2000 units TABS Take 1-2 capsules by mouth daily.  . metFORMIN (GLUCOPHAGE-XR) 750 MG 24 hr tablet Take 750 mg by mouth daily with breakfast.  . metoprolol succinate (TOPROL-XL) 50 MG 24 hr tablet TAKE 1 TABLET BY MOUTH DAILY, TAKE WITH OR IMMEDIATELY FOLLOWING A MEAL  . Multiple Vitamins-Minerals (MULTIVITAMIN WITH MINERALS) tablet Take 1 tablet by mouth daily.  . naratriptan (AMERGE) 2.5 MG tablet as needed.  Marland Kitchen omeprazole (PRILOSEC) 10 MG capsule Take 10 mg by mouth daily.  .  promethazine (PHENERGAN) 25 MG tablet Take 25 mg by mouth every 6 (six) hours as needed for nausea.     Allergies:   Clindamycin; Amitriptyline; Clindamycin/lincomycin; Erythromycin; Loestrin [norethindrone acet-ethinyl est]; Maxalt [rizatriptan benzoate]; Norethin-eth estrad biphasic; Nortriptyline hcl; Sumatriptan; and Zomig [zolmitriptan]   Social History   Socioeconomic History  . Marital status: Single    Spouse name: Not on file  . Number of children: Not on file  . Years of education: Not on file  . Highest education level: Not on file  Occupational History  . Occupation: Energy manager  Social Needs  . Financial resource strain: Not on file  . Food insecurity:    Worry: Not on file    Inability: Not on file  . Transportation needs:    Medical: Not on file    Non-medical: Not on file  Tobacco Use  . Smoking status: Never Smoker  . Smokeless tobacco: Never Used  Substance and Sexual Activity  . Alcohol use: Yes  . Drug use: No  . Sexual activity: Yes    Birth control/protection: IUD  Lifestyle  . Physical activity:    Days per week: Not on file    Minutes per session: Not on file  . Stress: Not on file  Relationships  .  Social connections:    Talks on phone: Not on file    Gets together: Not on file    Attends religious service: Not on file    Active member of club or organization: Not on file    Attends meetings of clubs or organizations: Not on file    Relationship status: Not on file  Other Topics Concern  . Not on file  Social History Narrative  . Not on file     Family History: The patient's family history is not on file.  No early family history of coronary artery disease  ROS:   Please see the history of present illness.     All other systems reviewed and are negative.  EKGs/Labs/Other Studies Reviewed:    The following studies were reviewed today: Prior office notes reviewed EKG and echo reviewed.  EKG:  EKG is  ordered today.  The  ekg ordered today demonstrates sinus rhythm 97 with nonspecific ST-T wave changes  Recent Labs: No results found for requested labs within last 8760 hours.  Recent Lipid Panel    Component Value Date/Time   CHOL  01/05/2009 0419    192        ATP III CLASSIFICATION:  <200     mg/dL   Desirable  604-540200-239  mg/dL   Borderline High  >=981>=240    mg/dL   High          TRIG 191100 01/05/2009 0419   HDL 39 (L) 01/05/2009 0419   CHOLHDL 4.9 01/05/2009 0419   VLDL 20 01/05/2009 0419   LDLCALC (H) 01/05/2009 0419    133        Total Cholesterol/HDL:CHD Risk Coronary Heart Disease Risk Table                     Men   Women  1/2 Average Risk   3.4   3.3  Average Risk       5.0   4.4  2 X Average Risk   9.6   7.1  3 X Average Risk  23.4   11.0        Use the calculated Patient Ratio above and the CHD Risk Table to determine the patient's CHD Risk.        ATP III CLASSIFICATION (LDL):  <100     mg/dL   Optimal  478-295100-129  mg/dL   Near or Above                    Optimal  130-159  mg/dL   Borderline  [id number]  mg/dL   High  >657>190     mg/dL   Very High    Physical Exam:    VS:  BP 106/70   Pulse 97   Ht 5' 9.25" (1.759 m)   Wt (!) 348 lb 1.9 oz (157.9 kg)   BMI 51.04 kg/m     Wt Readings from Last 3 Encounters:  06/03/18 (!) 348 lb 1.9 oz (157.9 kg)  04/23/17 (!) 340 lb (154.2 kg)  07/12/16 (!) 328 lb (148.8 kg)     GEN: Obese Well nourished, well developed in no acute distress HEENT: Normal NECK: No JVD; No carotid bruits LYMPHATICS: No lymphadenopathy CARDIAC: RRR, no murmurs, rubs, gallops RESPIRATORY:  Clear to auscultation without rales, wheezing or rhonchi  ABDOMEN: Soft, non-tender, non-distended MUSCULOSKELETAL:  No edema; No deformity  SKIN: Warm and dry NEUROLOGIC:  Alert and oriented x 3 PSYCHIATRIC:  Normal affect  ASSESSMENT:    1. PVC (premature ventricular contraction)   2. Palpitations   3. Morbid obesity (HCC)   4. Exercise-induced asthma    PLAN:     In order of problems listed above:  Previously mildly decreased left ventricular systolic function -EF 45 to 50% continue with Toprol.  Palpitations -Doing well with Toprol.  Morbid obesity -She continues to gain weight.  Continue to encourage decrease carbohydrates and weight loss  Exercise-induced asthma - She did request that she can have a albuterol inhaler to be used prior to exercise.  She does have wheezing during exercise.  I have given her albuterol MDI with no refills.  I do want her to discuss this further with Dr. Cliffton Asters in the future.  Medication Adjustments/Labs and Tests Ordered: Current medicines are reviewed at length with the patient today.  Concerns regarding medicines are outlined above.  Orders Placed This Encounter  Procedures  . EKG 12-Lead   Meds ordered this encounter  Medications  . albuterol (PROVENTIL HFA;VENTOLIN HFA) 108 (90 Base) MCG/ACT inhaler    Sig: Inhale 1 puff into the lungs as directed.    Dispense:  1 Inhaler    Refill:  0    Patient Instructions  Medication Instructions:  May use albuterol inhaler 5 to 20 minutes prior to exercise. Continue all other medications as listed.  If you need a refill on your cardiac medications before your next appointment, please call your pharmacy.   Follow-Up: At [facility], you and your health needs are our priority.  As part of our continuing mission to provide you with exceptional heart care, we have created designated Provider Care Teams.  These Care Teams include your primary Cardiologist (physician) and Advanced Practice Providers (APPs -  Physician Assistants and Nurse Practitioners) who all work together to provide you with the care you need, when you need it. You will need a follow up appointment in 12 months.  Please call our office 2 months in advance to schedule this appointment.  You may see Donato Schultz, MD or one of the following Advanced Practice Providers on your designated Care Team:    Norma Fredrickson, NP Nada Boozer, NP . Georgie Chard, NP  Thank you for choosing [facility]!!         Signed, Donato Schultz, MD  06/03/2018 5:15 PM    [facility]

## 2018-08-31 ENCOUNTER — Other Ambulatory Visit: Payer: Self-pay | Admitting: Cardiology

## 2019-03-10 ENCOUNTER — Other Ambulatory Visit: Payer: Self-pay | Admitting: Cardiology

## 2019-11-06 ENCOUNTER — Other Ambulatory Visit: Payer: Self-pay | Admitting: Cardiology

## 2020-02-01 ENCOUNTER — Telehealth: Payer: Self-pay | Admitting: Cardiology

## 2020-02-01 NOTE — Telephone Encounter (Signed)
New message   *STAT* If patient is at the pharmacy, call can be transferred to refill team.   1. Which medications need to be refilled? (please list name of each medication and dose if known) metoprolol succinate (TOPROL-XL) 50 MG 24 hr tablet  2. Which pharmacy/location (including street and city if local pharmacy) is medication to be sent to? [id number] - [place], [place] - [address]  3. Do they need a 30 day or 90 day supply? 90 day  Patient is out of medication and needs today.

## 2020-02-02 MED ORDER — METOPROLOL SUCCINATE ER 50 MG PO TB24: ORAL_TABLET | ORAL | 0 refills | Status: DC

## 2020-02-02 NOTE — Telephone Encounter (Signed)
Pt's medication was sent to pt's pharmacy as requested. Confirmation received.  °

## 2020-02-04 ENCOUNTER — Ambulatory Visit (INDEPENDENT_AMBULATORY_CARE_PROVIDER_SITE_OTHER): Payer: No Typology Code available for payment source | Admitting: Cardiology

## 2020-02-04 ENCOUNTER — Other Ambulatory Visit: Payer: Self-pay

## 2020-02-04 ENCOUNTER — Encounter: Payer: Self-pay | Admitting: Cardiology

## 2020-02-04 VITALS — BP 106/80 | HR 95 | Ht 69.25 in | Wt 351.0 lb

## 2020-02-04 DIAGNOSIS — J45990 Exercise induced bronchospasm: Secondary | ICD-10-CM

## 2020-02-04 DIAGNOSIS — I493 Ventricular premature depolarization: Secondary | ICD-10-CM | POA: Diagnosis not present

## 2020-02-04 DIAGNOSIS — R002 Palpitations: Secondary | ICD-10-CM | POA: Diagnosis not present

## 2020-02-04 MED ORDER — ALBUTEROL SULFATE HFA 108 (90 BASE) MCG/ACT IN AERS: INHALATION_SPRAY | RESPIRATORY_TRACT | 3 refills | Status: AC

## 2020-02-04 MED ORDER — METOPROLOL SUCCINATE ER 50 MG PO TB24: ORAL_TABLET | ORAL | 3 refills | Status: DC

## 2020-02-04 NOTE — Patient Instructions (Signed)
Medication Instructions:  The current medical regimen is effective;  continue present plan and medications.  *If you need a refill on your cardiac medications before your next appointment, please call your pharmacy*  Follow-Up: At [facility], you and your health needs are our priority.  As part of our continuing mission to provide you with exceptional heart care, we have created designated Provider Care Teams.  These Care Teams include your primary Cardiologist (physician) and Advanced Practice Providers (APPs -  Physician Assistants and Nurse Practitioners) who all work together to provide you with the care you need, when you need it.  We recommend signing up for the patient portal called "MyChart".  Sign up information is provided on this After Visit Summary.  MyChart is used to connect with patients for Virtual Visits (Telemedicine).  Patients are able to view lab/test results, encounter notes, upcoming appointments, etc.  Non-urgent messages can be sent to your provider as well.   To learn more about what you can do with MyChart, go to [email or web address].    Your next appointment:   12 month(s)  The format for your next appointment:   In Person  Provider:   Mark Skains, MD   Thank you for choosing [facility]!!      

## 2020-02-04 NOTE — Progress Notes (Signed)
Cardiology Office Note:    Date:  02/04/2020   ID:  Kathleen Montoya, DOB Oct 15, 1980, MRN [id number]  PCP:  Laurann Montana, MD  Cardiologist:  Donato Schultz, MD  Electrophysiologist:  None   Referring MD: Laurann Montana, MD     History of Present Illness:    Kathleen Montoya is a 39 y.o. female here for follow-up of PVCs in which Toprol has helped.  Has had nonspecific T wave changes in V2 and V3 on ECG.  Previously has been treated for exercise-induced asthma.  Time exercise but she usually wheezes.  Works as a Production designer, theatre/television/film rarely feels palpitations.  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  Rare palpitations, sometimes in the grocery store for instance.  Past Medical History:  Diagnosis Date  . Childhood asthma   . History of depression    In college  . History of HPV infection   . Hypercholesteremia   . Insomnia   . Low back pain   . Migraines   . Ovarian cyst   . Patent foramen ovale   . Pneumonia 05/2005  . Polycystic ovarian disease   . Tachycardia   . Vitamin D deficiency     Past Surgical History:  Procedure Laterality Date  . TONSILLECTOMY    . WISDOM TOOTH EXTRACTION      Current Medications: Current Meds  Medication Sig  . albuterol (VENTOLIN HFA) 108 (90 Base) MCG/ACT inhaler INHALE 1 PUFF BY MOUTH AS DIRECTED  . Cholecalciferol (VITAMIN D3) 2000 units TABS Take 1-2 capsules by mouth daily.  . metFORMIN (GLUCOPHAGE-XR) 750 MG 24 hr tablet Take 750 mg by mouth daily with breakfast.  . metoprolol succinate (TOPROL-XL) 50 MG 24 hr tablet TAKE 1 TABLET BY MOUTH DAILY, TAKE WITH OR IMMEDIATELY FOLLOWING A MEAL.  . Multiple Vitamins-Minerals (MULTIVITAMIN WITH MINERALS) tablet Take 1 tablet by mouth daily.  . naratriptan (AMERGE) 2.5 MG tablet as needed.  Marland Kitchen omeprazole (PRILOSEC) 10 MG capsule Take 10 mg by mouth daily.  . promethazine (PHENERGAN) 25 MG tablet  Take 25 mg by mouth every 6 (six) hours as needed for nausea.  Marland Kitchen Ubrogepant (UBRELVY) 100 MG TABS Take 100 mg by mouth as needed.  . [DISCONTINUED] albuterol (PROVENTIL HFA;VENTOLIN HFA) 108 (90 Base) MCG/ACT inhaler INHALE 1 PUFF BY MOUTH AS DIRECTED  . [DISCONTINUED] metoprolol succinate (TOPROL-XL) 50 MG 24 hr tablet TAKE 1 TABLET BY MOUTH DAILY, TAKE WITH OR IMMEDIATELY FOLLOWING A MEAL. Please keep upcoming appt in August with Dr. Anne Fu before anymore refills. Thank you     Allergies:   Clindamycin, Amitriptyline, Clindamycin/lincomycin, Erythromycin, Loestrin [norethindrone acet-ethinyl est], Maxalt [rizatriptan benzoate], Norethin-eth estrad biphasic, Nortriptyline hcl, Sumatriptan, and Zomig [zolmitriptan]   Social History   Socioeconomic History  . Marital status: Single    Spouse name: Not on file  . Number of children: Not on file  . Years of education: Not on file  . Highest education level: Not on file  Occupational History  . Occupation: Energy manager  Tobacco Use  . Smoking status: Never Smoker  . Smokeless tobacco: Never Used  Vaping Use  . Vaping Use: Never used  Substance and Sexual Activity  . Alcohol use: Yes  . Drug use: No  . Sexual activity: Yes    Birth control/protection: I.U.D.  Other Topics Concern  . Not on file  Social History Narrative  . Not on file  Social Determinants of Health   Financial Resource Strain:   . Difficulty of Paying Living Expenses:   Food Insecurity:   . Worried About Programme researcher, broadcasting/film/video in the Last Year:   . Barista in the Last Year:   Transportation Needs:   . Freight forwarder (Medical):   Marland Kitchen Lack of Transportation (Non-Medical):   Physical Activity:   . Days of Exercise per Week:   . Minutes of Exercise per Session:   Stress:   . Feeling of Stress :   Social Connections:   . Frequency of Communication with Friends and Family:   . Frequency of Social Gatherings with Friends and Family:   .  Attends Religious Services:   . Active Member of Clubs or Organizations:   . Attends Banker Meetings:   Marland Kitchen Marital Status:      Family History: The patient's family history is not on file.  No early family history of coronary artery disease  ROS:   Please see the history of present illness.     All other systems reviewed and are negative.  EKGs/Labs/Other Studies Reviewed:    The following studies were reviewed today: Prior office notes reviewed EKG and echo reviewed.  EKG:  EKG is  ordered today.  The ekg ordered today demonstrates sinus rhythm 97 with nonspecific ST-T wave changes  Recent Labs: No results found for requested labs within last 8760 hours.  Recent Lipid Panel    Component Value Date/Time   CHOL  01/05/2009 0419    192        ATP III CLASSIFICATION:  <200     mg/dL   Desirable  712-458  mg/dL   Borderline High  >=099    mg/dL   High          TRIG 833 01/05/2009 0419   HDL 39 (L) 01/05/2009 0419   CHOLHDL 4.9 01/05/2009 0419   VLDL 20 01/05/2009 0419   LDLCALC (H) 01/05/2009 0419    133        Total Cholesterol/HDL:CHD Risk Coronary Heart Disease Risk Table                     Men   Women  1/2 Average Risk   3.4   3.3  Average Risk       5.0   4.4  2 X Average Risk   9.6   7.1  3 X Average Risk  23.4   11.0        Use the calculated Patient Ratio above and the CHD Risk Table to determine the patient's CHD Risk.        ATP III CLASSIFICATION (LDL):  <100     mg/dL   Optimal  825-053  mg/dL   Near or Above                    Optimal  130-159  mg/dL   Borderline  976-734  mg/dL   High  >193     mg/dL   Very High    Physical Exam:    VS:  BP 106/80   Pulse 95   Ht 5' 9.25" (1.759 m)   Wt (!) 351 lb (159.2 kg)   SpO2 96%   BMI 51.46 kg/m     Wt Readings from Last 3 Encounters:  02/04/20 (!) 351 lb (159.2 kg)  06/03/18 (!) 348 lb 1.9 oz (157.9 kg)  04/23/17 (!) 340  lb (154.2 kg)     GEN: Well nourished, well developed, in  no acute distress obese HEENT: normal  Neck: no JVD, carotid bruits, or masses Cardiac: RRR; no murmurs, rubs, or gallops,no edema  Respiratory:  clear to auscultation bilaterally, normal work of breathing GI: soft, nontender, nondistended, + BS MS: no deformity or atrophy  Skin: warm and dry, no rash Neuro:  Alert and Oriented x 3, Strength and sensation are intact Psych: euthymic mood, full affect   ASSESSMENT:    1. PVC (premature ventricular contraction)   2. Palpitations   3. Exercise-induced asthma    PLAN:    In order of problems listed above:  Previously mildly decreased left ventricular systolic function -EF 45 to 50% continue with Toprol.  Palpitations -Doing well with Toprol.  No changes made  Morbid obesity -She continues to gain weight.  Continue to encourage decrease carbohydrates and weight loss.  Continue to encourage  Exercise-induced asthma -Previously had helped her with albuterol but I asked her to see Dr. Cliffton Asters for further refills.  Medication Adjustments/Labs and Tests Ordered: Current medicines are reviewed at length with the patient today.  Concerns regarding medicines are outlined above.  Orders Placed This Encounter  Procedures  . EKG 12-Lead   Meds ordered this encounter  Medications  . albuterol (VENTOLIN HFA) 108 (90 Base) MCG/ACT inhaler    Sig: INHALE 1 PUFF BY MOUTH AS DIRECTED    Dispense:  8.5 g    Refill:  3  . metoprolol succinate (TOPROL-XL) 50 MG 24 hr tablet    Sig: TAKE 1 TABLET BY MOUTH DAILY, TAKE WITH OR IMMEDIATELY FOLLOWING A MEAL.    Dispense:  90 tablet    Refill:  3    Pt must keep upcoming appt in August with Dr. Anne Fu before anymore refills. Final Attempt    Patient Instructions  Medication Instructions:  The current medical regimen is effective;  continue present plan and medications.  *If you need a refill on your cardiac medications before your next appointment, please call your pharmacy*  Follow-Up: At  [facility], you and your health needs are our priority.  As part of our continuing mission to provide you with exceptional heart care, we have created designated Provider Care Teams.  These Care Teams include your primary Cardiologist (physician) and Advanced Practice Providers (APPs -  Physician Assistants and Nurse Practitioners) who all work together to provide you with the care you need, when you need it.  We recommend signing up for the patient portal called "MyChart".  Sign up information is provided on this After Visit Summary.  MyChart is used to connect with patients for Virtual Visits (Telemedicine).  Patients are able to view lab/test results, encounter notes, upcoming appointments, etc.  Non-urgent messages can be sent to your provider as well.   To learn more about what you can do with MyChart, go to [email or web address].    Your next appointment:   12 month(s)  The format for your next appointment:   In Person  Provider:   Donato Schultz, MD   Thank you for choosing [facility]!!        Signed, Donato Schultz, MD  02/04/2020 2:47 PM    [facility]

## 2020-10-19 ENCOUNTER — Emergency Department (HOSPITAL_COMMUNITY): Payer: PRIVATE HEALTH INSURANCE

## 2020-10-19 ENCOUNTER — Other Ambulatory Visit: Payer: Self-pay

## 2020-10-19 ENCOUNTER — Encounter (HOSPITAL_COMMUNITY): Payer: Self-pay

## 2020-10-19 ENCOUNTER — Emergency Department (HOSPITAL_COMMUNITY)
Admission: EM | Admit: 2020-10-19 | Discharge: 2020-10-19 | Disposition: A | Discharge status: Home / Self Care | Payer: PRIVATE HEALTH INSURANCE | Attending: Emergency Medicine | Admitting: Emergency Medicine

## 2020-10-19 DIAGNOSIS — M545 Low back pain, unspecified: Secondary | ICD-10-CM | POA: Insufficient documentation

## 2020-10-19 DIAGNOSIS — R002 Palpitations: Secondary | ICD-10-CM | POA: Insufficient documentation

## 2020-10-19 DIAGNOSIS — R0789 Other chest pain: Secondary | ICD-10-CM | POA: Diagnosis not present

## 2020-10-19 LAB — I-STAT BETA HCG BLOOD, ED (MC, WL, AP ONLY): I-stat hCG, quantitative: <5 m[IU]/mL (ref <5)

## 2020-10-19 LAB — BASIC METABOLIC PANEL
Anion gap: 8 (ref 5–15)
BUN: 23 mg/dL — ABNORMAL HIGH (ref 6–20)
CO2: 24 mmol/L (ref 22–32)
Calcium: 9.3 mg/dL (ref 8.9–10.3)
Chloride: 107 mmol/L (ref 98–111)
Creatinine, Ser: 0.86 mg/dL (ref 0.44–1.00)
GFR, Estimated: >60 mL/min (ref >60)
Glucose, Bld: 111 mg/dL — ABNORMAL HIGH (ref 70–99)
Potassium: 4.4 mmol/L (ref 3.5–5.1)
Sodium: 139 mmol/L (ref 135–145)

## 2020-10-19 LAB — CBC
HCT: 41.1 % (ref 36.0–46.0)
Hemoglobin: 13.2 g/dL (ref 12.0–15.0)
MCH: 29.5 pg (ref 26.0–34.0)
MCHC: 32.1 g/dL (ref 30.0–36.0)
MCV: 91.7 fL (ref 80.0–100.0)
Platelets: 327 10*3/uL (ref 150–400)
RBC: 4.48 MIL/uL (ref 3.87–5.11)
RDW: 13.8 % (ref 11.5–15.5)
WBC: 10 10*3/uL (ref 4.0–10.5)
nRBC: 0 % (ref 0.0–0.2)

## 2020-10-19 LAB — BRAIN NATRIURETIC PEPTIDE: B Natriuretic Peptide: 58.1 pg/mL (ref 0.0–100.0)

## 2020-10-19 LAB — TSH: TSH: 2.003 u[IU]/mL (ref 0.350–4.500)

## 2020-10-19 LAB — TROPONIN I (HIGH SENSITIVITY)
Troponin I (High Sensitivity): <2 ng/L (ref <18)
Troponin I (High Sensitivity): 3 ng/L (ref <18)

## 2020-10-19 LAB — MAGNESIUM: Magnesium: 2.3 mg/dL (ref 1.7–2.4)

## 2020-10-19 MED ORDER — SODIUM CHLORIDE 0.9 % IV BOLUS: 1000.0000 mL | Freq: Once | INTRAVENOUS | Status: DC

## 2020-10-19 MED ORDER — SODIUM CHLORIDE 0.9 % IV BOLUS
500.0000 mL | Freq: Once | INTRAVENOUS | Status: AC
  Administered 2020-10-19: 500 mL via INTRAVENOUS

## 2020-10-19 MED ORDER — DICLOFENAC SODIUM 1 % EX GEL: 4.0000 g | Freq: Four times a day (QID) | CUTANEOUS | 1 refills | Status: DC

## 2020-10-19 MED ORDER — SODIUM CHLORIDE 0.9 % IV SOLN: Freq: Once | INTRAVENOUS | Status: AC

## 2020-10-19 MED ORDER — FENTANYL CITRATE (PF) 100 MCG/2ML IJ SOLN
50.0000 ug | INTRAMUSCULAR | Status: DC | PRN
  Administered 2020-10-19: 50 ug via INTRAVENOUS
  Filled 2020-10-19: qty 2

## 2020-10-19 NOTE — ED Triage Notes (Signed)
Chest pain last night, has not been taking her metoprolol for the past few days. Pt states her heart rate was 52bpm at home. Lower back pain x 1 week.

## 2020-10-19 NOTE — ED Provider Notes (Addendum)
[facility] Provider Note   CSN: [id number] Arrival date & time: 10/19/20  0403     History Chief Complaint  Patient presents with  . Chest Pain  . low heart rate    Kathleen RoverChristine A Montoya is a 40 y.o. female.  HPI Patient is a 40 year old female presented today with a past medical history significant for PFO, very mild heart failure, heart palpitations, PVC on metoprolol.  Patient is complaining primarily of heart palpitations.  She states that yesterday evening she felt that she had some chest pressure and palpitations.  She states that she placed a pulse oximeter on her finger to check her heart rate and found it was in the 50s/60s.  She had not yet taken her daily metoprolol so she did not take it that night.  She states that her symptoms continued through today.  She denies any shortness of breath and states that she does not have any significant chest pain presently however she does still feel that she is having heart palpitations.   Patient states that she was diagnosed with a lumbar strain this past week.  States that she has had no bowel or bladder incontinence, no trauma to her back, denies any history of IV drug use and denies any weakness or numbness in her legs.  She states that she has been taking Norco, prednisone, Robaxin for this.   History without symptoms of urinary or stool retention or incontinence, neurologic changes such as sensation change or weakness lower extremities, coagulopathy or blood thinner use, is not elderly or with history of osteoporosis, denies any history of cancer, fever, IV drug use, weight changes (unexplained), or prolonged steroid use.     Past Medical History:  Diagnosis Date  . Childhood asthma   . History of depression    In college  . History of HPV infection   . Hypercholesteremia   . Insomnia   . Low back pain   . Migraines   . Ovarian cyst   . Patent foramen ovale   . Patent foramen ovale    . Pneumonia 05/2005  . Polycystic ovarian disease   . Tachycardia   . Vitamin D deficiency     There are no problems to display for this patient.   Past Surgical History:  Procedure Laterality Date  . TONSILLECTOMY    . WISDOM TOOTH EXTRACTION       OB History   No obstetric history on file.     History reviewed. No pertinent family history.  Social History   Tobacco Use  . Smoking status: Never Smoker  . Smokeless tobacco: Never Used  Vaping Use  . Vaping Use: Never used  Substance Use Topics  . Alcohol use: Yes  . Drug use: No    Home Medications Prior to Admission medications   Medication Sig Start Date End Date Taking? Authorizing Provider  albuterol (VENTOLIN HFA) 108 (90 Base) MCG/ACT inhaler INHALE 1 PUFF BY MOUTH AS DIRECTED Patient taking differently: Inhale 1 puff into the lungs as needed for wheezing or shortness of breath. 02/04/20  Yes Jake BatheSkains, Mark C, MD  diclofenac Sodium (VOLTAREN) 1 % GEL Apply 4 g topically 4 (four) times daily. 10/19/20  Yes Gailen ShelterFondaw, Sadako Cegielski S, PA  etonogestrel-ethinyl estradiol (NUVARING) 0.12-0.015 MG/24HR vaginal ring Place 1 each vaginally every 28 (twenty-eight) days. 1 ring leave in place for 3 weeks, remove, and replace with a new ring after 7 day break 10/03/20  Yes [provider]  HYDROcodone-acetaminophen (NORCO/VICODIN) 5-325 MG tablet Take 1 tablet by mouth every 6 (six) hours as needed for pain. 10/16/20  Yes [provider]  methocarbamol (ROBAXIN) 750 MG tablet Take 750 mg by mouth 3 (three) times daily. 10/16/20  Yes [provider]  metoprolol succinate (TOPROL-XL) 50 MG 24 hr tablet TAKE 1 TABLET BY MOUTH DAILY, TAKE WITH OR IMMEDIATELY FOLLOWING A MEAL. Patient taking differently: Take 50 mg by mouth at bedtime. 02/04/20  Yes Jake Bathe, MD  naproxen (NAPROSYN) 500 MG tablet Take 500 mg by mouth 2 (two) times daily as needed (pain). 10/11/20  Yes [provider]  naratriptan (AMERGE) 2.5  MG tablet Take 2.5 mg by mouth as needed for migraine. 05/19/14  Yes [provider]  predniSONE (DELTASONE) 20 MG tablet Take 20-60 mg by mouth every other day. Take 3 tablets on day 1-3, take 2 tablets on day 4-6, take 1 tablet on day 7-9. 10/16/20  Yes [provider]  promethazine (PHENERGAN) 25 MG tablet Take 25 mg by mouth every 6 (six) hours as needed for nausea.   Yes [provider]  Ubrogepant (UBRELVY) 100 MG TABS Take 100 mg by mouth as needed (migraine).   Yes [provider]    Allergies    Clindamycin, Topiramate, Amitriptyline, Clindamycin/lincomycin, Erythromycin, Loestrin [norethindrone acet-ethinyl est], Maxalt [rizatriptan benzoate], Norethindrone-eth estradiol, Nortriptyline hcl, Sumatriptan, and Zomig [zolmitriptan]  Review of Systems   Review of Systems  Constitutional: Positive for fatigue. Negative for chills and fever.  HENT: Negative for congestion.   Eyes: Negative for pain.  Respiratory: Positive for chest tightness. Negative for cough and shortness of breath.   Cardiovascular: Positive for chest pain and palpitations. Negative for leg swelling.  Gastrointestinal: Negative for abdominal pain, diarrhea, nausea and vomiting.  Genitourinary: Negative for dysuria.  Musculoskeletal: Negative for myalgias.  Skin: Negative for rash.  Neurological: Negative for dizziness and headaches.    Physical Exam Updated Vital Signs BP 105/79 (BP Location: Left Arm)   Pulse 67   Temp 98.5 F (36.9 C) (Oral)   Resp 19   Ht 5\' 9"  (1.753 m)   Wt (!) 159 kg   SpO2 98%   BMI 51.76 kg/m   Physical Exam Vitals and nursing note reviewed.  Constitutional:      General: She is not in acute distress.    Appearance: She is obese.  HENT:     Head: Normocephalic and atraumatic.     Nose: Nose normal.  Eyes:     General: No scleral icterus. Cardiovascular:     Rate and Rhythm: Normal rate and regular rhythm.     Pulses: Normal pulses.      Heart sounds: Normal heart sounds.  Pulmonary:     Effort: Pulmonary effort is normal. No respiratory distress.     Breath sounds: No wheezing.  Abdominal:     Palpations: Abdomen is soft.     Tenderness: There is no abdominal tenderness.  Musculoskeletal:     Cervical back: Normal range of motion.     Right lower leg: No edema.     Left lower leg: No edema.     Comments: Diffuse paralumbar muscular tenderness.  No midline bony tenderness.  Full range motion of spine.  Upper and lower extremity strength 5/5 with good coordination of movement.  Ambulatory with normal gait.  Skin:    General: Skin is warm and dry.     Capillary Refill: Capillary refill takes less than 2 seconds.  Neurological:     Mental Status: She is alert. Mental status is at baseline.  Psychiatric:        Mood and Affect: Mood normal.        Behavior: Behavior normal.     ED Results / Procedures / Treatments   Labs (all labs ordered are listed, but only abnormal results are displayed) Labs Reviewed  BASIC METABOLIC PANEL - Abnormal; Notable for the following components:      Result Value   Glucose, Bld 111 (*)    BUN 23 (*)    All other components within normal limits  CBC  BRAIN NATRIURETIC PEPTIDE  MAGNESIUM  TSH  I-STAT BETA HCG BLOOD, ED ([facility], [facility], [facility] ONLY)  TROPONIN I (HIGH SENSITIVITY)  TROPONIN I (HIGH SENSITIVITY)    EKG EKG Interpretation  Date/Time:  Thursday October 19 2020 04:22:04 EDT Ventricular Rate:  69 PR Interval:  150 QRS Duration: 88 QT Interval:  414 QTC Calculation: 443 R Axis:   94 Text Interpretation: Normal sinus rhythm Rightward axis Nonspecific T wave abnormality Abnormal ECG When compared with ECG of   01/04/2009, HEART RATE has decreased Confirmed by Dione Booze ([id number]) on 10/19/2020 5:59:13 AM   Radiology DG Chest 2 View  Result Date: 10/19/2020 CLINICAL DATA:  Chest pain EXAM: CHEST - 2 VIEW COMPARISON:  Radiograph 01/04/2009 FINDINGS: Some basilar predominant  streaky opacities are favored to reflect atelectatic change. No consolidation, pneumothorax, effusion. No convincing features of edema. The cardiomediastinal contours are unremarkable. No acute osseous or soft tissue abnormality. IMPRESSION: Some basilar predominant streaky opacities are favored to reflect atelectatic change. Electronically Signed   By: Kreg Shropshire M.D.   On: 10/19/2020 04:43    Procedures Procedures   Medications Ordered in ED Medications  0.9 %  sodium chloride infusion ( Intravenous Stopped 10/19/20 1000)  sodium chloride 0.9 % bolus 500 mL (0 mLs Intravenous Stopped 10/19/20 1144)    ED Course  I have reviewed the triage vital signs and the nursing notes.  Pertinent labs & imaging results that were available during my care of the patient were reviewed by me and considered in my medical decision making (see chart for details).    MDM Rules/Calculators/A&P                          Patient is 40 year old female with past medical history significant for frequent PVCs placed on metoprolol.  She states that she has not taken her medication since Tuesday evening however she noticed that her heart rate is very low.  She states that she was having the palpitations and put a pulse oximeter on her finger and notes that her heart rate was between 50 and 60 bpm.  She states she does not feel lightheaded or short of breath.  She states she has no chest pain and when seen in triage she endorsed chest pain because of the abnormal sensation she has with heart palpitations.  She denies any syncope or near syncope.  She states that unrelated to her evaluation today she has low back pain which has been ongoing for approximately 1 week.  She is being treated with prednisone, muscle relaxers and Norco.  Patient's physical exam is reassuring.  Heart rate is between 60 and 70 bpm during most of my evaluations and reevaluations the patient.    Her blood pressure has remained within normal limits  with systolic greater than 100.  She is ambulatory without any  symptoms of lightheadedness or dizziness.  I discussed this case with my attending physician.  She did receive 1 L normal saline and 1 dose of fentanyl for her back pain.  Magnesium, BNP i-STAT ECG and troponin and TSH all unremarkable.  BMP with no electrolyte dyscrasias with normal kidney function. Chest x-ray unremarkable.  EKG also unremarkable heart rate of 69  She was monitored here in the ER for several hours on telemetry.  Heart rate seems to predominantly be in the 70s.  She will stop her metoprolol and follow-up with cardiology.  Return to ER for any new or concerning symptoms or lightheadedness or syncope.  Back pain History without symptoms of urinary or stool retention or incontinence, neurologic changes such as sensation change or weakness lower extremities, coagulopathy or blood thinner use, is not elderly or with history of osteoporosis, denies any history of cancer, fever, IV drug use, weight changes (unexplained), or prolonged steroid use.   In terms of patient's back pain physical exam is most consistent with muscular strain. Doubt cauda equina or disc herniation d/t lack of saddle anesthesia/bowel or bladder incontinence or urinary retention, normal gait and reassuring physical examination without neurologic deficits.   History is not supportive of kidney stone, AAA, AD, pancreatitis, PE or PTX. Patient has no CVA tenderness or urinary sx to suggest pyelonephritis or kidney stone.   Will manage patient conservatively at this time. NSAIDs, back exercises/stretches, heat therapy and follow up with PCP if symptoms do not resolve in 3-4 weeks. Patient offered muscle relaxer for comfort at night. Counseled on need to return to ED for fever, worsening or concerning symptoms. Patient agreeable to plan and states understanding of follow up plans and return precautions.    Patient overall well-appearing at discharge.   She is agreeable to plan.  All questions answered to the facility.  Voltaren gel prescribed for her low back pain. Her bradycardia when compared to prior may be related to her metoprolol versus the certainly medication she is on including Robaxin and Norco  Final Clinical Impression(s) / ED Diagnoses Final diagnoses:  Acute low back pain without sciatica, unspecified back pain laterality  Heart palpitations    Rx / DC Orders ED Discharge Orders         Ordered    diclofenac Sodium (VOLTAREN) 1 % GEL  4 times daily        10/19/20 1224           Solon Augusta Wanette, [place] 10/20/20 0927    Gailen Shelter, PA 10/20/20 9628    Vanetta Mulders, MD 10/20/20 [phone]

## 2020-10-19 NOTE — ED Notes (Signed)
Patient transported to X-ray 

## 2020-10-19 NOTE — Discharge Instructions (Addendum)
Do not take metoprolol until you follow up with cardiology.  Please drink plenty of water.  You may always return to the ER for any new or concerning symptoms.  Continue to take your medications as prescribed.  I do recommend Voltaren gel which I have written a prescription for.  You may apply ice to your back 4 times daily.  Also keep in mind that muscle relaxer such as Robaxin may cause drowsiness and may decrease your blood pressure.

## 2021-03-12 ENCOUNTER — Other Ambulatory Visit: Payer: Self-pay | Admitting: Cardiology

## 2021-07-12 IMAGING — CR DG CHEST 2V
2 series · 2 of 2 positions shown · non-contrast
Comparison: Radiograph 01/04/2009

CLINICAL DATA: Chest pain

EXAM:
CHEST - 2 VIEW

[chest pa]
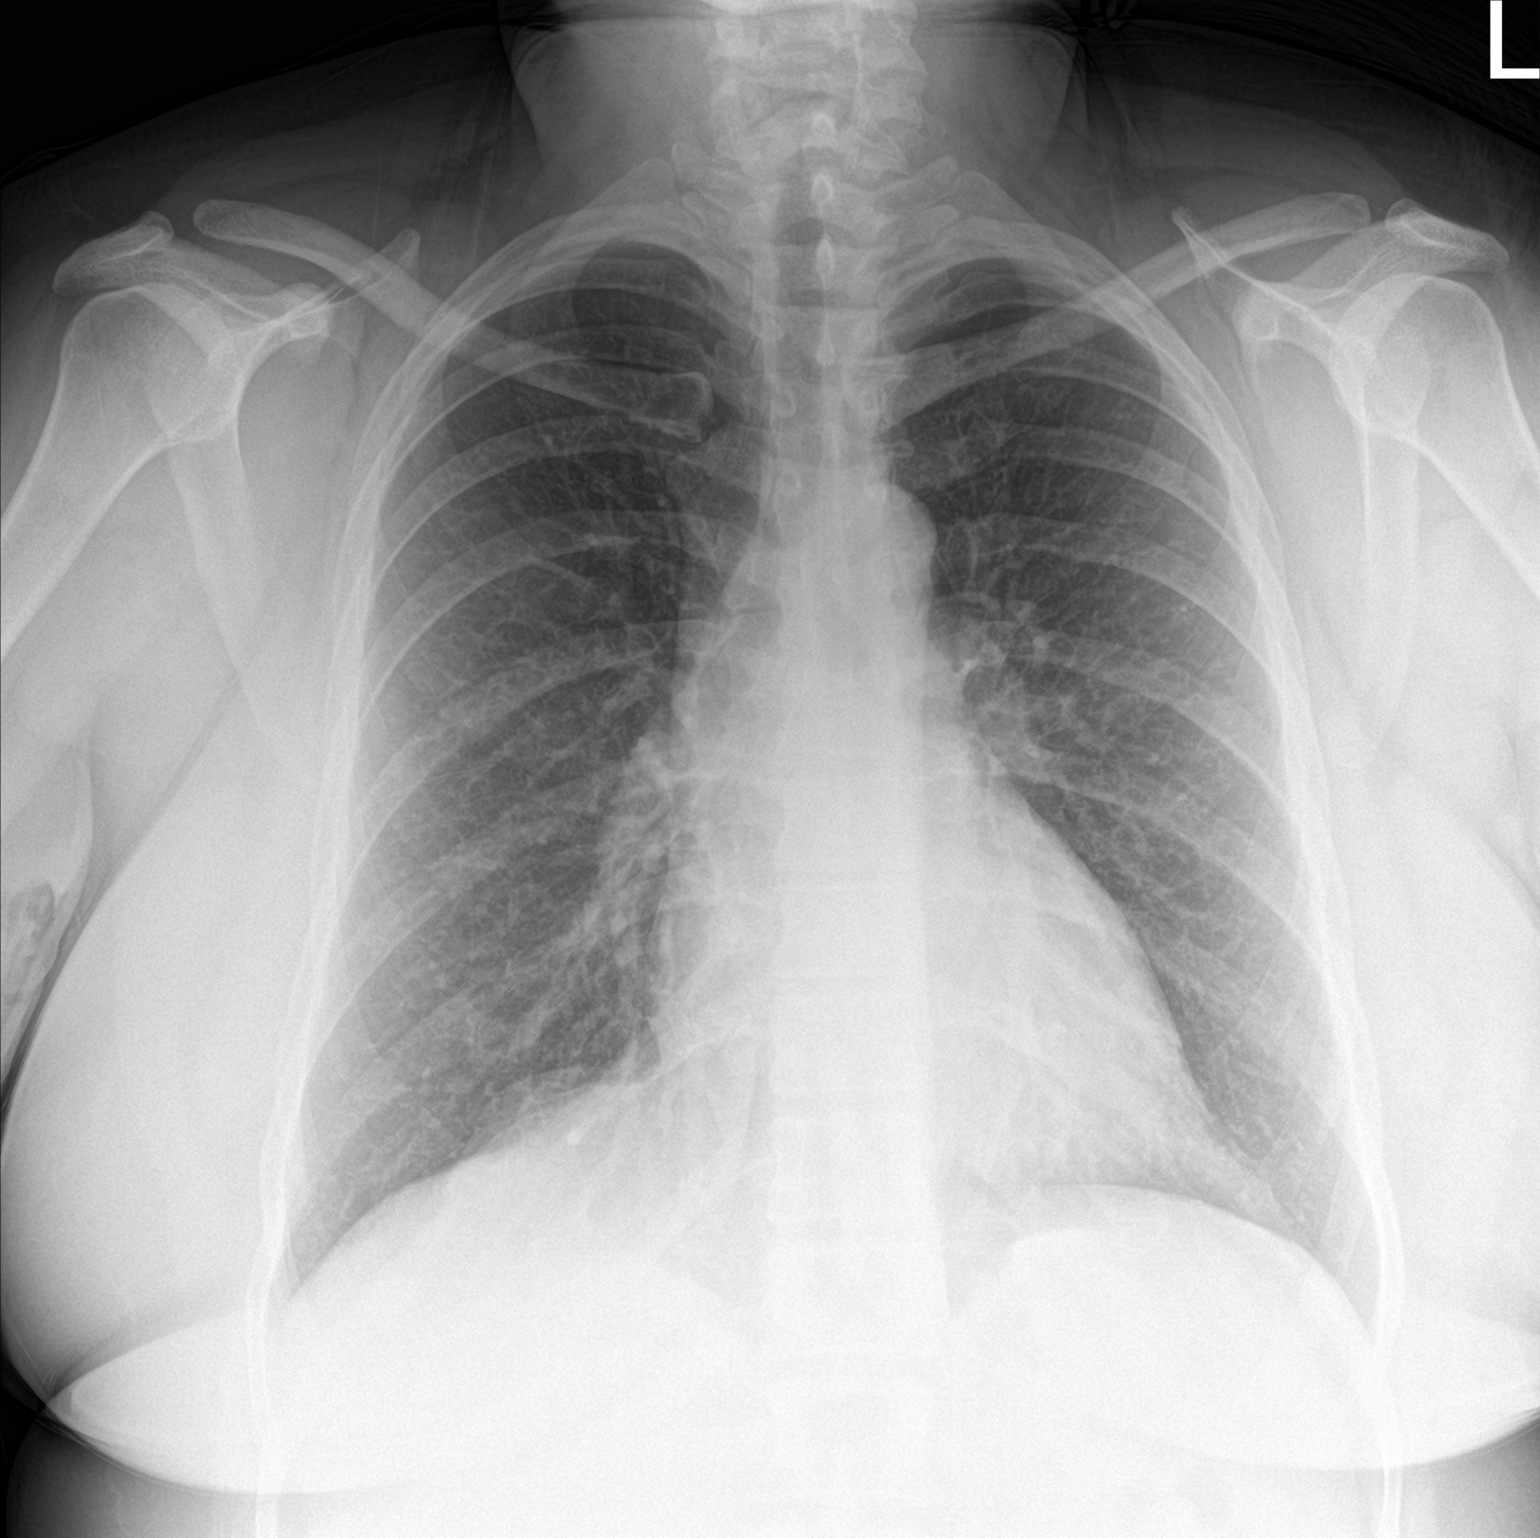

[chest lat]
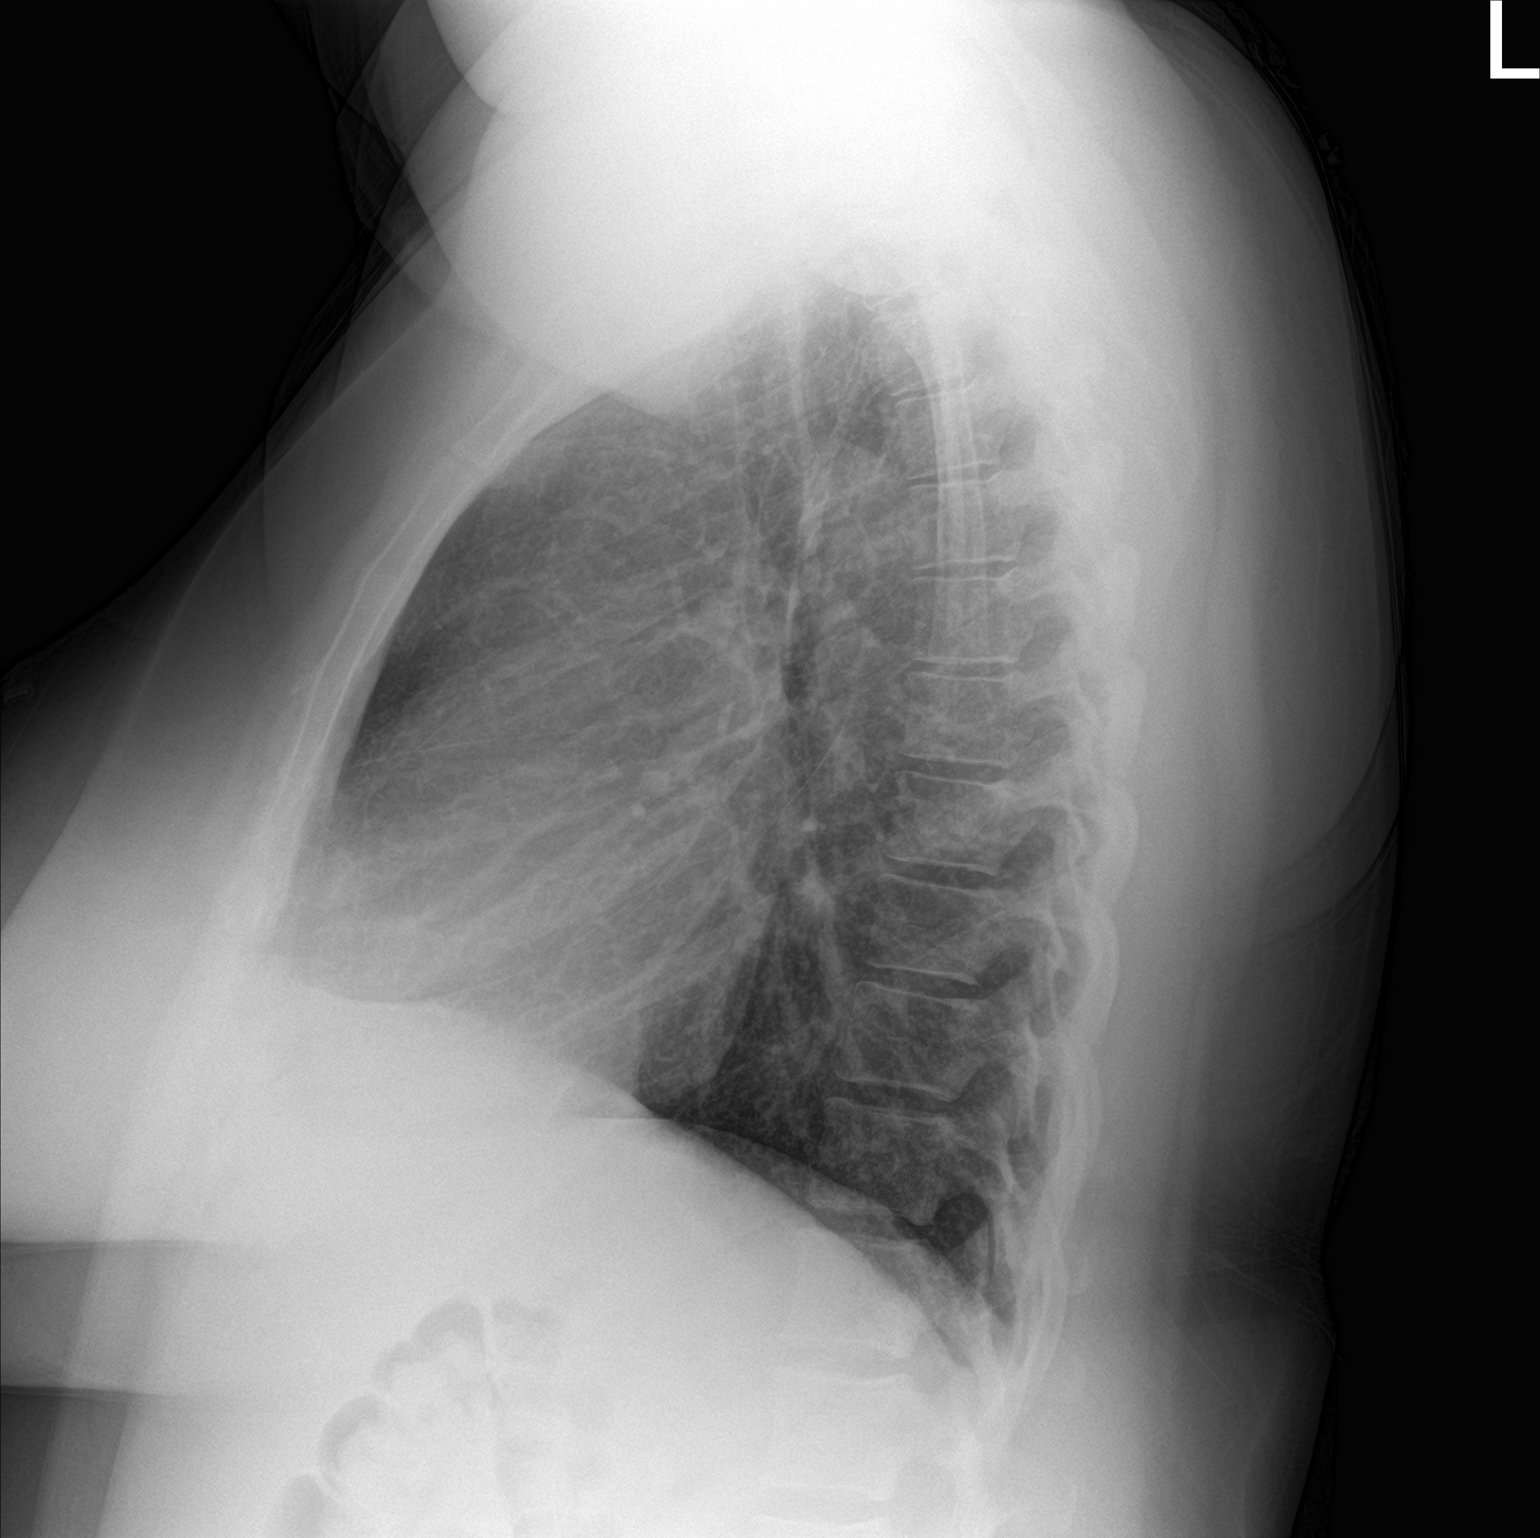

[2 of 2 positions shown; findings below may reference images not displayed]

FINDINGS: Some basilar predominant streaky opacities are favored to reflect
atelectatic change. No consolidation, pneumothorax, effusion. No
convincing features of edema. The cardiomediastinal contours are
unremarkable. No acute osseous or soft tissue abnormality.
IMPRESSION: Some basilar predominant streaky opacities are favored to reflect
atelectatic change.

## 2021-08-17 ENCOUNTER — Telehealth: Payer: Self-pay | Admitting: Cardiology

## 2021-08-17 MED ORDER — METOPROLOL SUCCINATE ER 50 MG PO TB24: ORAL_TABLET | ORAL | 0 refills | Status: DC

## 2021-08-17 NOTE — Telephone Encounter (Signed)
Pt's medication was sent to pt's pharmacy as requested. Confirmation received.  °

## 2021-08-17 NOTE — Telephone Encounter (Signed)
°*  STAT* If patient is at the pharmacy, call can be transferred to refill team.   1. Which medications need to be refilled? (please list name of each medication and dose if known)  metoprolol succinate (TOPROL-XL) 50 MG 24 hr tablet  2. Which pharmacy/location (including street and city if local pharmacy) is medication to be sent to? [id number] - [place], [place] - [address]  3. Do they need a 30 day or 90 day supply? 90 with refills    Patient is scheduled to see Dr. Anne Fu 11/14/21

## 2021-11-14 ENCOUNTER — Ambulatory Visit: Payer: PRIVATE HEALTH INSURANCE | Admitting: Cardiology

## 2021-11-24 ENCOUNTER — Other Ambulatory Visit: Payer: Self-pay | Admitting: Cardiology

## 2021-12-25 ENCOUNTER — Telehealth: Payer: Self-pay | Admitting: Pharmacist

## 2021-12-25 ENCOUNTER — Ambulatory Visit: Payer: PRIVATE HEALTH INSURANCE | Admitting: Cardiology

## 2021-12-25 ENCOUNTER — Encounter: Payer: Self-pay | Admitting: Cardiology

## 2021-12-25 VITALS — BP 138/80 | HR 81 | Ht 69.0 in | Wt 355.0 lb

## 2021-12-25 DIAGNOSIS — H832X9 Labyrinthine dysfunction, unspecified ear: Secondary | ICD-10-CM

## 2021-12-25 DIAGNOSIS — R002 Palpitations: Secondary | ICD-10-CM

## 2021-12-25 NOTE — Telephone Encounter (Signed)
Pt referred by Dr Anne Fu to look into GLP1RA coverage for weight loss. [place] on [employer], will try submitting prior authorization for Saxenda. Unable to complete prior auth online, her plan requires this to be done over the phone at [phone].

## 2021-12-25 NOTE — Progress Notes (Signed)
Cardiology Office Note:    Date:  12/25/2021   ID:  Kathleen Montoya, DOB 1980-07-19, MRN [id number]  PCP:  Laurann Montana, MD  Cardiologist:  Donato Schultz, MD  Electrophysiologist:  None   Referring MD: Laurann Montana, MD    History of Present Illness:    Kathleen Montoya is a 41 y.o. female here for the follow-up of palpitations.   She presented to the ED on 10/19/2020 with concerns for palpitations and chest pressure. Her heart rate per her pulse ox was 50s-60s. Magnesium, BNP i-STAT ECG and troponin and TSH all unremarkable.  BMP with no electrolyte dyscrasias with normal kidney function. Chest x-ray unremarkable.  EKG also unremarkable heart rate of 69. Her metoprolol was stopped and it was recommended to follow up with cardiology.  Previously here for follow-up of PVCs in which Toprol has helped.  Has had nonspecific T wave changes in V2 and V3 on ECG.  Previously has been treated for exercise-induced asthma.  Time exercise but she usually wheezes.  Works as a Production designer, theatre/television/film rarely feels palpitations.  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  At her last appointment she reported rare palpitations, sometimes in the grocery store for instance. She continued to gain weight. She was encouraged to decrease carbohydrates and focus on weight loss.  Today, she reports having a weird day. After waking up this morning she was suddenly feeling significantly dizzy. Of note, she recognizes this dizziness was different than vertigo. Since then, she continues to notice weird sensations or  "inner ear moments" when she tilts her head. She denies feeling any pain, but admits to feeling anxious. Additionally she has an associated sensation of tingling on the top of her scalp which is concerning for her.  Still has PVC's, some days more frequent than others "in batches". Generally she has been taking  metoprolol daily. She may take it earlier than usual, but she denies needing to take any extra doses.  Previously she noticed "little twinges" in her chest, always localized just to the left of her sternum when they occur. They have not been an issue lately.  Her blood pressure in clinic today is 138/80, which she states is elevated for her.  She has struggled with weight loss. She reports cycles of gaining more weight than she loses (successfully losing 10-15 pounds, then gaining 20 pounds). At times it is difficult to find the time to exercise. When she does exercise she occasionally experiences "asthmatic reactions". She does not attribute this to her heart, and plans to follow-up with her PCP.  At night she has noticed her oxygen levels lowering to about 95%.  She endorses a family history of diabetes.  Seeing her primary care physician, Dr. Cliffton Asters in the next month.  She denies any peripheral edema. No headaches, syncope, orthopnea, or PND.   Past Medical History:  Diagnosis Date   Childhood asthma    History of depression    In college   History of HPV infection    Hypercholesteremia    Insomnia    Low back pain    Migraines    Ovarian cyst    Patent foramen ovale    Patent foramen ovale    Pneumonia 05/2005   Polycystic ovarian disease    Tachycardia    Vitamin D deficiency     Past Surgical History:  Procedure Laterality Date   TONSILLECTOMY  WISDOM TOOTH EXTRACTION      Current Medications: Current Meds  Medication Sig   albuterol (VENTOLIN HFA) 108 (90 Base) MCG/ACT inhaler INHALE 1 PUFF BY MOUTH AS DIRECTED   Cholecalciferol (VITAMIN D) 125 MCG (5000 UT) CAPS Take 1 capsule by mouth daily.   metoprolol succinate (TOPROL-XL) 50 MG 24 hr tablet TAKE 1 TABLET BY MOUTH DAILY WITH OR IMMEDIATELY FOLLOWING A MEAL   Multiple Vitamin (MULTIVITAMIN) capsule Take 1 capsule by mouth daily.   naproxen (NAPROSYN) 500 MG tablet Take 500 mg by mouth 2 (two) times daily  as needed (pain).   naratriptan (AMERGE) 2.5 MG tablet Take 2.5 mg by mouth as needed for migraine.   omeprazole (PRILOSEC OTC) 20 MG tablet Take 20 mg by mouth every other day.   promethazine (PHENERGAN) 25 MG tablet Take 25 mg by mouth every 6 (six) hours as needed for nausea.     Allergies:   Clindamycin, Topiramate, Amitriptyline, Clindamycin/lincomycin, Erythromycin, Loestrin [norethindrone acet-ethinyl est], Maxalt [rizatriptan benzoate], Norethindrone-eth estradiol, Nortriptyline hcl, Sumatriptan, and Zomig [zolmitriptan]   Social History   Socioeconomic History   Marital status: Single    Spouse name: Not on file   Number of children: Not on file   Years of education: Not on file   Highest education level: Not on file  Occupational History   Occupation: Energy manager  Tobacco Use   Smoking status: Never   Smokeless tobacco: Never  Vaping Use   Vaping Use: Never used  Substance and Sexual Activity   Alcohol use: Yes   Drug use: No   Sexual activity: Yes    Birth control/protection: I.U.D.  Other Topics Concern   Not on file  Social History Narrative   Not on file   Social Determinants of Health   Financial Resource Strain: Not on file  Food Insecurity: Not on file  Transportation Needs: Not on file  Physical Activity: Not on file  Stress: Not on file  Social Connections: Not on file     Family History: The patient's family history is not on file.  No early family history of coronary artery disease  ROS:   Please see the history of present illness.    (+) Dizziness (+) Strange sensations with head tilting (+) Tingling of scalp (+) Palpitations (+) Stress/Anxiety All other systems reviewed and are negative.  EKGs/Labs/Other Studies Reviewed:    The following studies were reviewed today: Prior office notes reviewed EKG and echo reviewed.  Echocardiogram  07/31/2016: Study Conclusions   - Left ventricle: The cavity size was normal. Wall  thickness was    normal. Systolic function was normal. The estimated ejection    fraction was in the range of 50% to 55%. Left ventricular    diastolic function parameters were normal.  - Left atrium: The atrium was mildly dilated.  - Atrial septum: No obvious PFO by 2D and color flow no bubble    study performed.    EKG:  EKG is personally reviewed. 12/25/2021:  Sinus rhythm. Rate 81 bpm. Nonspecific T wave changes. 02/04/2020: sinus rhythm 97 with nonspecific ST-T wave changes   Recent Labs: No results found for requested labs within last 365 days.   Recent Lipid Panel    Component Value Date/Time   CHOL  01/05/2009 0419    192        ATP III CLASSIFICATION:  <200     mg/dL   Desirable  161-096  mg/dL   Borderline High  >=045  mg/dL   High          TRIG 161 01/05/2009 0419   HDL 39 (L) 01/05/2009 0419   CHOLHDL 4.9 01/05/2009 0419   VLDL 20 01/05/2009 0419   LDLCALC (H) 01/05/2009 0419    133        Total Cholesterol/HDL:CHD Risk Coronary Heart Disease Risk Table                     Men   Women  1/2 Average Risk   3.4   3.3  Average Risk       5.0   4.4  2 X Average Risk   9.6   7.1  3 X Average Risk  23.4   11.0        Use the calculated Patient Ratio above and the CHD Risk Table to determine the patient's CHD Risk.        ATP III CLASSIFICATION (LDL):  <100     mg/dL   Optimal  096-045  mg/dL   Near or Above                    Optimal  130-159  mg/dL   Borderline  409-811  mg/dL   High  >914     mg/dL   Very High    Physical Exam:    VS:  BP 138/80   Pulse 81   Ht 5\' 9"  (1.753 m)   Wt (!) 355 lb (161 kg)   SpO2 98%   BMI 52.42 kg/m     Wt Readings from Last 3 Encounters:  12/25/21 (!) 355 lb (161 kg)  10/19/20 (!) 350 lb 8.5 oz (159 kg)  02/04/20 (!) 351 lb (159.2 kg)     GEN: Well nourished, well developed, in no acute distress obese HEENT: normal  Neck: no JVD, carotid bruits, or masses Cardiac: RRR; no murmurs, rubs, or gallops, no edema   Respiratory:  clear to auscultation bilaterally, normal work of breathing GI: soft, nontender, nondistended, + BS MS: no deformity or atrophy  Skin: warm and dry, no rash Neuro:  Alert and Oriented x 3, Strength and sensation are intact Psych: euthymic mood, full affect   ASSESSMENT:    1. Palpitations   2. Morbid obesity (HCC)   3. Vestibular dysfunction, unspecified laterality     PLAN:    In order of problems listed above:  Morbid obesity (HCC) Has fluctuated weight loss and weight gain over the last several years.  I have discussed with my pharmacy team, Margaretmary Dys, and we will see if we are able to obtain liraglutide injection and titration through prior authorization.  Aundra Millet will call her with details.  Eventually, could transition to [facility].  I think this would be highly beneficial for her to help minimize her overall cardiac risk.  Palpitations Continues to take her metoprolol as prescribed 50 mg once a day.  Excellent.  Occasionally will feel flip-flop.  Vestibular dysfunction Felt unsteadiness with gait, changes when tilting head.  In the past has had inner ear infections with vertigo type symptoms.  Overall feeling better at this point.    Follow-up:  1 year.  Medication Adjustments/Labs and Tests Ordered: Current medicines are reviewed at length with the patient today.  Concerns regarding medicines are outlined above.   Orders Placed This Encounter  Procedures   EKG 12-Lead   No orders of the defined types were placed in this encounter.  Patient Instructions  Medication Instructions:  The current medical regimen is effective;  continue present plan and medications.  *If you need a refill on your cardiac medications before your next appointment, please call your pharmacy*  Follow-Up: At [facility], you and your health needs are our priority.  As part of our continuing mission to provide you with exceptional heart care, we have created designated  Provider Care Teams.  These Care Teams include your primary Cardiologist (physician) and Advanced Practice Providers (APPs -  Physician Assistants and Nurse Practitioners) who all work together to provide you with the care you need, when you need it.  We recommend signing up for the patient portal called "MyChart".  Sign up information is provided on this After Visit Summary.  MyChart is used to connect with patients for Virtual Visits (Telemedicine).  Patients are able to view lab/test results, encounter notes, upcoming appointments, etc.  Non-urgent messages can be sent to your provider as well.   To learn more about what you can do with MyChart, go to [email or web address].    Your next appointment:   1 year(s)  The format for your next appointment:   In Person  Provider:   Donato Schultz, MD {  Information About Sugar        Liraglutide Injection (Weight Management) Bernie Covey What is this medication? LIRAGLUTIDE (LIR a GLOO tide) promotes weight loss. It may also be used to maintain weight loss. It works by decreasing appetite. Changes to diet and exercise are often combined with this medication. This medicine may be used for other purposes; ask your health care provider or pharmacist if you have questions. COMMON BRAND NAME(S): Saxenda What should I tell my care team before I take this medication? They need to know if you have any of these conditions: Endocrine tumors (MEN 2) or if someone in your family had these tumors Gallbladder disease High cholesterol History of alcohol abuse problem History of pancreatitis Kidney disease or if you are on dialysis Liver disease Previous swelling of the tongue, face, or lips with difficulty breathing, difficulty swallowing, hoarseness, or tightening of the throat Stomach problems Suicidal thoughts, plans, or attempt; a previous suicide attempt by you or a family member Thyroid cancer or if someone in your family had thyroid cancer An  unusual or allergic reaction to liraglutide, other medications, foods, dyes, or preservatives Pregnant or trying to get pregnant Breast-feeding How should I use this medication? This medication is for injection under the skin of your upper leg, stomach area, or upper arm. You will be taught how to prepare and give this medication. Use exactly as directed. Take your medication at regular intervals. Do not take it more often than directed. This medication comes with INSTRUCTIONS FOR USE. Ask your pharmacist for directions on how to use this medication. Read the information carefully. Talk to your pharmacist or care team if you have questions. It is important that you put your used needles and syringes in a special sharps container. Do not put them in a trash can. If you do not have a sharps container, call your pharmacist or care team to get one. A special MedGuide will be given to you by the pharmacist with each prescription and refill. Be sure to read this information carefully each time. Talk to your care team about the use of this medication in children. While it may be prescribed for children as young as 68 years of age for selected conditions, precautions do apply. Overdosage: If you think you have taken  too much of this medicine contact a poison control center or emergency room at once. NOTE: This medicine is only for you. Do not share this medicine with others. What if I miss a dose? If you miss a dose, take it as soon as you can. If it is almost time for your next dose, take only that dose. Do not take double or extra doses. If you miss your dose for 3 days or more, call your care team to talk about how to restart this medicine. What may interact with this medication? Insulin and other medications for diabetes This list may not describe all possible interactions. Give your health care provider a list of all the medicines, herbs, non-prescription drugs, or dietary supplements you use. Also tell  them if you smoke, drink alcohol, or use illegal drugs. Some items may interact with your medicine. What should I watch for while using this medication? Visit your care team for regular checks on your progress. Drink plenty of fluids while taking this medication. Check with your care team if you get an attack of severe diarrhea, nausea, and vomiting. The loss of too much body fluid can make it dangerous for you to take this medication. This medication may affect blood sugar levels. Ask your care team if changes in diet or medications are needed if you have diabetes. Patients and their families should watch out for worsening depression or thoughts of suicide. Also watch out for sudden changes in feelings such as feeling anxious, agitated, panicky, irritable, hostile, aggressive, impulsive, severely restless, overly excited and hyperactive, or not being able to sleep. If this happens, especially at the beginning of treatment or after a change in dose, call your care team. Women should inform their care team if they wish to become pregnant or think they might be pregnant. Losing weight while pregnant is not advised and may cause harm to the unborn child. Talk to your care team for more information. What side effects may I notice from receiving this medication? Side effects that you should report to your care team as soon as possible: Allergic reactions or angioedema--skin rash, itching, hives, swelling of the face, eyes, lips, tongue, arms, or legs, trouble swallowing or breathing Fast or irregular heartbeat Gallbladder problems--severe stomach pain, nausea, vomiting, fever Kidney injury--decrease in the amount of urine, swelling of the ankles, hands, or feet Pancreatitis--severe stomach pain that spreads to your back or gets worse after eating or when touched, fever, nausea, vomiting Thoughts of suicide or self-harm, worsening mood, feelings of depression Thyroid cancer--new mass or lump in the neck,  pain or trouble swallowing, trouble breathing, hoarseness Side effects that usually do not require medical attention (report to your care team if they continue or are bothersome): Constipation Dizziness Fatigue Headache Loss of Appetite Nausea Upset stomach This list may not describe all possible side effects. Call your doctor for medical advice about side effects. You may report side effects to FDA at 1-800-FDA-1088. Where should I keep my medication? Keep out of the reach of children and pets. Store unopened pen in a refrigerator between 2 and 8 degrees C (36 and 46 degrees F). Do not freeze or use if the medication has been frozen. Protect from light and excessive heat. After you first use the pen, it can be stored at room temperature between 15 and 30 degrees C (59 and 86 degrees F) or in a refrigerator. Throw away your used pen after 30 days or after the expiration date, whichever comes first.  Do not store your pen with the needle attached. If the needle is left on, medication may leak from the pen. NOTE: This sheet is a summary. It may not cover all possible information. If you have questions about this medicine, talk to your doctor, pharmacist, or health care provider.  2023 Elsevier/Gold Standard (2020-07-21 00:00:00)    I,Mathew Stumpf,acting as a scribe for [employer], MD.,have documented all relevant documentation on the behalf of Donato Schultz, MD,as directed by  Donato Schultz, MD while in the presence of Donato Schultz, MD.  I, Donato Schultz, MD, have reviewed all documentation for this visit. The documentation on 12/25/21 for the exam, diagnosis, procedures, and orders are all accurate and complete.   Signed, Donato Schultz, MD  12/25/2021 5:06 PM    [facility]

## 2021-12-26 NOTE — Telephone Encounter (Signed)
Called pt's insurance, they stated request is submitted by a different division, [phone]. Their line stated PAs can be submitted online at [email or web address]. This has been done.

## 2022-01-02 NOTE — Telephone Encounter (Addendum)
Received denial for Saxenda - excluded medication pt's formulary, her plan does not cover weight loss medications. Called pt to let her know, no answer and VM is full so unable to leave a message. She does not have MyChart set up either. Will try back later.

## 2022-01-03 NOTE — Telephone Encounter (Signed)
2nd call made to pt, no answer, VM full. 

## 2022-01-10 NOTE — Telephone Encounter (Signed)
3rd call made to pt. Spoke with her and advised her that her insurance does not cover weight loss medications. She was appreciative for the call.

## 2022-04-01 ENCOUNTER — Other Ambulatory Visit: Payer: Self-pay

## 2022-04-01 MED ORDER — METOPROLOL SUCCINATE ER 50 MG PO TB24: ORAL_TABLET | ORAL | 2 refills | Status: DC

## 2022-10-25 ENCOUNTER — Emergency Department (HOSPITAL_BASED_OUTPATIENT_CLINIC_OR_DEPARTMENT_OTHER)
Admission: EM | Admit: 2022-10-25 | Discharge: 2022-10-25 | Disposition: A | Discharge status: Home / Self Care | Payer: PRIVATE HEALTH INSURANCE | Attending: Emergency Medicine | Admitting: Emergency Medicine

## 2022-10-25 ENCOUNTER — Encounter (HOSPITAL_BASED_OUTPATIENT_CLINIC_OR_DEPARTMENT_OTHER): Payer: Self-pay | Admitting: Urology

## 2022-10-25 DIAGNOSIS — J45909 Unspecified asthma, uncomplicated: Secondary | ICD-10-CM | POA: Insufficient documentation

## 2022-10-25 DIAGNOSIS — Z7951 Long term (current) use of inhaled steroids: Secondary | ICD-10-CM | POA: Insufficient documentation

## 2022-10-25 DIAGNOSIS — L0231 Cutaneous abscess of buttock: Secondary | ICD-10-CM | POA: Insufficient documentation

## 2022-10-25 MED ORDER — AMOXICILLIN-POT CLAVULANATE 875-125 MG PO TABS: 1.0000 | ORAL_TABLET | Freq: Two times a day (BID) | ORAL | 0 refills | Status: DC

## 2022-10-25 MED ORDER — LIDOCAINE HCL (PF) 1 % IJ SOLN
10.0000 mL | Freq: Once | INTRAMUSCULAR | Status: AC
  Administered 2022-10-25: 10 mL
  Filled 2022-10-25: qty 10

## 2022-10-25 MED ORDER — OXYCODONE-ACETAMINOPHEN 5-325 MG PO TABS: 1.0000 | ORAL_TABLET | Freq: Four times a day (QID) | ORAL | 0 refills | Status: DC | PRN

## 2022-10-25 NOTE — Discharge Instructions (Addendum)
Please take your antibiotics as prescribed. Take tylenol/ibuprofen or Percocet as needed for pain.  Please keep the wound dry and clean.  You can remove the packing in 2 to 3 days. I recommend close follow-up with PCP for reevaluation.  Please do not hesitate to return to emergency department if worrisome signs symptoms we discussed become apparent.

## 2022-10-25 NOTE — ED Provider Notes (Signed)
[place] EMERGENCY DEPARTMENT AT [facility] [place] Provider Note   CSN: [id number] Arrival date & time: 10/25/22  1737     History  Chief Complaint  Patient presents with  . Abscess    Kathleen Montoya is a 42 y.o. female presents today for evaluation of an abscess.  Patient states she noticed an abscess between her buttocks that started approximately a week ago.  She reports purulent drainage.  Reports significant discomfort with sitting and sudden position.  Denies any fevers.  Denies any abscess in the same location before.  States her PCP told her to come to the ER for further evaluation management due to area of skin redness around the abscess.   Abscess     Past Medical History:  Diagnosis Date  . Childhood asthma   . History of depression    In college  . History of HPV infection   . Hypercholesteremia   . Insomnia   . Low back pain   . Migraines   . Ovarian cyst   . Patent foramen ovale   . Patent foramen ovale   . Pneumonia 05/2005  . Polycystic ovarian disease   . Tachycardia   . Vitamin D deficiency    Past Surgical History:  Procedure Laterality Date  . TONSILLECTOMY    . WISDOM TOOTH EXTRACTION       Home Medications Prior to Admission medications   Medication Sig Start Date End Date Taking? Authorizing Provider  albuterol (VENTOLIN HFA) 108 (90 Base) MCG/ACT inhaler INHALE 1 PUFF BY MOUTH AS DIRECTED 02/04/20   Jake Bathe, MD  Cholecalciferol (VITAMIN D) 125 MCG (5000 UT) CAPS Take 1 capsule by mouth daily.    [provider]  diclofenac Sodium (VOLTAREN) 1 % GEL Apply 4 g topically 4 (four) times daily. Patient not taking: Reported on 12/25/2021 10/19/20   Gailen Shelter, PA  etonogestrel-ethinyl estradiol (NUVARING) 0.12-0.015 MG/24HR vaginal ring Place 1 each vaginally every 28 (twenty-eight) days. 1 ring leave in place for 3 weeks, remove, and replace with a new ring after 7 day break Patient not taking: Reported on  12/25/2021 10/03/20   [provider]  HYDROcodone-acetaminophen (NORCO/VICODIN) 5-325 MG tablet Take 1 tablet by mouth every 6 (six) hours as needed for pain. Patient not taking: Reported on 12/25/2021 10/16/20   [provider]  methocarbamol (ROBAXIN) 750 MG tablet Take 750 mg by mouth 3 (three) times daily. Patient not taking: Reported on 12/25/2021 10/16/20   [provider]  metoprolol succinate (TOPROL-XL) 50 MG 24 hr tablet Take with or immediately following a meal. 04/01/22   Jake Bathe, MD  Multiple Vitamin (MULTIVITAMIN) capsule Take 1 capsule by mouth daily.    [provider]  naproxen (NAPROSYN) 500 MG tablet Take 500 mg by mouth 2 (two) times daily as needed (pain). 10/11/20   [provider]  naratriptan (AMERGE) 2.5 MG tablet Take 2.5 mg by mouth as needed for migraine. 05/19/14   [provider]  omeprazole (PRILOSEC OTC) 20 MG tablet Take 20 mg by mouth every other day.    [provider]  predniSONE (DELTASONE) 20 MG tablet Take 20-60 mg by mouth every other day. Take 3 tablets on day 1-3, take 2 tablets on day 4-6, take 1 tablet on day 7-9. Patient not taking: Reported on 12/25/2021 10/16/20   [provider]  promethazine (PHENERGAN) 25 MG tablet Take 25 mg by mouth every 6 (six) hours as needed for nausea.  [provider]  Ubrogepant (UBRELVY) 100 MG TABS Take 100 mg by mouth as needed (migraine). Patient not taking: Reported on 12/25/2021    [provider]      Allergies    Clindamycin, Topiramate, Amitriptyline, Clindamycin/lincomycin, Erythromycin, Loestrin [norethindrone acet-ethinyl est], Maxalt [rizatriptan benzoate], Norethindrone-eth estradiol, Nortriptyline hcl, Sumatriptan, and Zomig [zolmitriptan]    Review of Systems   Review of Systems Negative except as per HPI.  Physical Exam Updated Vital Signs BP (!) 122/91 (BP Location: Left Arm)   Pulse (!) 123   Temp 99.5 F  (37.5 C) (Oral)   Resp 16   Ht 5\' 9"  (1.753 m)   Wt (!) 161 kg   SpO2 96%   BMI 52.42 kg/m  Physical Exam Vitals and nursing note reviewed.  Constitutional:      Appearance: Normal appearance.  HENT:     Head: Normocephalic and atraumatic.     Mouth/Throat:     Mouth: Mucous membranes are moist.  Eyes:     General: No scleral icterus. Cardiovascular:     Rate and Rhythm: Normal rate and regular rhythm.     Pulses: Normal pulses.     Heart sounds: Normal heart sounds.  Pulmonary:     Effort: Pulmonary effort is normal.     Breath sounds: Normal breath sounds.  Abdominal:     General: Abdomen is flat.     Palpations: Abdomen is soft.     Tenderness: There is no abdominal tenderness.  Musculoskeletal:        General: No deformity.  Skin:    General: Skin is warm.     Findings: No rash.     Comments: 1 x 1 cm fluctuant mass with overlying skin erythema on the right buttocks.  Neurological:     General: No focal deficit present.     Mental Status: She is alert.  Psychiatric:        Mood and Affect: Mood normal.    ED Results / Procedures / Treatments   Labs (all labs ordered are listed, but only abnormal results are displayed) Labs Reviewed - No data to display  EKG None  Radiology No results found.  Procedures .Marland KitchenIncision and Drainage  Date/Time: 10/25/2022 9:08 PM  Performed by: Jeanelle Malling, PA Authorized by: Jeanelle Malling, PA   Consent:    Consent obtained:  Verbal   Consent given by:  Patient   Risks discussed:  Bleeding, incomplete drainage, pain and damage to other organs   Alternatives discussed:  No treatment Universal protocol:    Procedure explained and questions answered to patient or proxy's satisfaction: yes     Relevant documents present and verified: yes     Test results available : yes     Imaging studies available: yes     Required blood products, implants, devices, and special equipment available: yes     Site/side marked: yes     Immediately  prior to procedure, a time out was called: yes     Patient identity confirmed:  Verbally with patient Location:    Type:  Abscess   Size:  1 x 1 cm   Location: buttocks. Pre-procedure details:    Skin preparation:  Betadine Anesthesia:    Anesthesia method:  Local infiltration   Local anesthetic:  Lidocaine 1% w/o epi Procedure type:    Complexity:  Simple Procedure details:    Ultrasound guidance: yes     Needle aspiration: no     Incision types:  Single straight   Incision depth:  Subcutaneous   Wound management:  Probed and deloculated, irrigated with saline and extensive cleaning   Drainage:  Purulent   Drainage amount:  Moderate   Packing materials:  1/4 in gauze   Amount 1/4":  5 cm Post-procedure details:    Procedure completion:  Tolerated well, no immediate complications     Medications Ordered in ED Medications  lidocaine (PF) (XYLOCAINE) 1 % injection 10 mL (10 mLs Infiltration Given by Other 10/25/22 1805)    ED Course/ Medical Decision Making/ A&P                             Medical Decision Making Risk Prescription drug management.   This patient presents to the ED for abscess, this involves an extensive number of treatment options, and is a complaint that carries with a high risk of complications and morbidity.  The differential diagnosis includes abscess, pilonidal cyst, epidermal cyst, cellulitis or infectious etiology.  This is not an exhaustive list.  Imaging studies: Bedside ultrasound done by this provider show a 1 x 1 cm superficial hypoechoic pocket in the right buttocks.  Problem list/ ED course/ Critical interventions/ Medical management: HPI: See above Vital signs within normal range and stable throughout visit. Laboratory/imaging studies significant for: See above. On physical examination, patient is afebrile and appears in no acute distress. This patient presents with a painful fluid pocket with fluctuance and surrounding induration and  erythema, concerning for an abscess of the right buttocks. The abscess was anesthetized with lidocaine and then I&D was performed with deloculation and purulence was expressed. There is no lymphangitic spread visible. Low concern for osteomyelitis. Patient is not immunocompromised, and there is no bullae, pain out of proportion, or rapid progression concerning for necrotizing fasciitis. Patient to be discharged home with Augmentin with follow up with their PCP.  I have reviewed the patient home medicines and have made adjustments as needed.  Cardiac monitoring/EKG: The patient was maintained on a cardiac monitor.  I personally reviewed and interpreted the cardiac monitor which showed an underlying rhythm of: sinus rhythm.  Additional history obtained: External records from outside source obtained and reviewed including: Chart review including previous notes, labs, imaging.  Consultations obtained:  Disposition Continued outpatient therapy. Follow-up with PCP recommended for reevaluation of symptoms. Treatment plan discussed with patient.  Pt acknowledged understanding was agreeable to the plan. Worrisome signs and symptoms were discussed with patient, and patient acknowledged understanding to return to the ED if they noticed these signs and symptoms. Patient was stable upon discharge.   This chart was dictated using voice recognition software.  Despite best efforts to proofread,  errors can occur which can change the documentation meaning.           Final Clinical Impression(s) / ED Diagnoses Final diagnoses:  Abscess of buttock, right    Rx / DC Orders ED Discharge Orders          Ordered    amoxicillin-clavulanate (AUGMENTIN) 875-125 MG tablet  Every 12 hours        10/25/22 1832    oxyCODONE-acetaminophen (PERCOCET/ROXICET) 5-325 MG tablet  Every 6 hours PRN        10/25/22 1838              Jeanelle Malling, PA 10/25/22 2109    Terald Sleeper, MD 10/26/22 1428

## 2022-10-25 NOTE — ED Triage Notes (Signed)
Pt states abscess between buttocks that started approx 1 week ago  Reports purulent drainage  Denies fever

## 2023-02-05 DIAGNOSIS — G43109 Migraine with aura, not intractable, without status migrainosus: Secondary | ICD-10-CM | POA: Diagnosis not present

## 2023-02-05 DIAGNOSIS — G43719 Chronic migraine without aura, intractable, without status migrainosus: Secondary | ICD-10-CM | POA: Diagnosis not present

## 2023-02-05 DIAGNOSIS — M542 Cervicalgia: Secondary | ICD-10-CM | POA: Diagnosis not present

## 2023-04-01 ENCOUNTER — Ambulatory Visit: Payer: BC Managed Care – PPO | Attending: Cardiology | Admitting: Cardiology

## 2023-04-01 ENCOUNTER — Encounter: Payer: Self-pay | Admitting: Cardiology

## 2023-04-01 VITALS — BP 110/60 | HR 87 | Ht 69.0 in | Wt 375.0 lb

## 2023-04-01 DIAGNOSIS — Z79899 Other long term (current) drug therapy: Secondary | ICD-10-CM

## 2023-04-01 DIAGNOSIS — R002 Palpitations: Secondary | ICD-10-CM | POA: Diagnosis not present

## 2023-04-01 DIAGNOSIS — R0602 Shortness of breath: Secondary | ICD-10-CM

## 2023-04-01 MED ORDER — SPIRONOLACTONE 25 MG PO TABS: 25.0000 mg | ORAL_TABLET | Freq: Every day | ORAL | 3 refills | Status: DC

## 2023-04-01 NOTE — Progress Notes (Signed)
Cardiology Office Note:  .   Date:  04/01/2023  ID:  Kathleen Montoya, DOB 07/27/1980, MRN [id number] PCP: Laurann Montana, MD  [facility] Providers Cardiologist:  Donato Schultz, MD     History of Present Illness: .   Kathleen Montoya is a 42 y.o. female Discussed with the use of AI scribe software   History of Present Illness   Previously diagnosed with PVCs and nonspecific T wave changes on ECG, presents for a follow-up consultation. She reports experiencing increased shortness of breath and palpitations, which she attributes to a chronic heart condition. The patient notes that these symptoms have become more frequent and noticeable, even during routine activities such as blowing up balloons. She also reports daily swelling in her ankles, which sometimes does not fully resolve even with elevation.  The patient has been on Metoprolol 50mg  for the management of her PVCs. She reports that the frequency of these episodes varies, with some weeks being more noticeable than others.  In addition to her cardiac concerns, the patient has been dealing with significant stress due to recent changes in her professional life. 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 However, the company's decision to go in-house has resulted in increased stress and workload, leading the patient to consider leaving the company.  The patient has a family history of diabetes and is currently on a regimen of Metoprolol 50mg . Her last echocardiogram was performed on 07/31/16, showing an ejection fraction of 50-55% with no obvious PFO. The patient expresses interest in understanding the realistic potential for improving her cardiac function through lifestyle changes and medication adjustments. She also expresses concern about the potential side effects of new medications, particularly in relation to her PCOS and higher testosterone  levels.  The patient is interested in exploring options for weight loss medications once her employment situation stabilizes and she has consistent insurance coverage. She acknowledges the need for increased physical activity and dietary changes to manage her symptoms and improve her overall health.       ROS: No CP  Studies Reviewed: Marland Kitchen   EKG Interpretation Date/Time:  Tuesday April 01 2023 16:20:59 EDT Ventricular Rate:  87 PR Interval:  162 QRS Duration:  96 QT Interval:  362 QTC Calculation: 435 R Axis:   63  Text Interpretation: Normal sinus rhythm T wave abnormality, consider anterior ischemia When compared with ECG of 19-Oct-2020 04:22, No significant change was found Confirmed by Donato Schultz ([id number]) on 04/01/2023 5:12:26 PM    Results DIAGNOSTIC Echocardiogram: EF 50-55%, No obvious PFO (07/31/2016) EKG: Nonspecific T wave changes in V2 and V3  Risk Assessment/Calculations:            Physical Exam:   VS:  BP 110/60   Pulse 87   Ht 5\' 9"  (1.753 m)   Wt (!) 375 lb (170.1 kg)   SpO2 96%   BMI 55.38 kg/m    Wt Readings from Last 3 Encounters:  04/01/23 (!) 375 lb (170.1 kg)  10/25/22 (!) 354 lb 15.1 oz (161 kg)  12/25/21 (!) 355 lb (161 kg)    GEN: Well nourished, well developed in no acute distress NECK: No JVD; No carotid bruits CARDIAC: RRR, no murmurs, no rubs, no gallops RESPIRATORY:  Clear to auscultation without rales, wheezing or rhonchi  ABDOMEN: Soft, non-tender, non-distended EXTREMITIES:  No edema; No deformity   ASSESSMENT AND PLAN: .  Premature Ventricular Contractions (PVCs) Stable on Metoprolol 50mg . Reports occasional PVCs, frequency varies week to week. -Continue Metoprolol 50mg .  Chronic Heart Failure Reports increased shortness of breath and daily lower extremity edema. Last echocardiogram on 07/31/16 showed EF 50-55% with no obvious PFO. -Start Spironolactone 25mg  daily. -Check BMP in 2 weeks after starting  Spironolactone. -Order repeat echocardiogram to assess for changes in cardiac function. -Follow-up in 4 months to assess response to Spironolactone and review echocardiogram results.  Stress Reports high stress levels due to recent job changes and dissatisfaction with current employment. -Encouraged to continue exploring other employment opportunities and consider stress management strategies.  General Health Maintenance -Encouraged to continue efforts towards weight loss and increased physical activity. -Consider future discussion of weight loss medications pending stability of employment and insurance coverage.              Signed, Donato Schultz, MD

## 2023-04-01 NOTE — Patient Instructions (Signed)
Medication Instructions:  Your physician has recommended you make the following change in your medication:   1) START spironolactone (Aldactone) 25 mg once daily (take in the mornings)  *If you need a refill on your cardiac medications before your next appointment, please call your pharmacy*  Lab Work: In 2 weeks: BMET (you may come to the lab on your appointment day any time between 7:30 AM and 4:00 PM--you do not need to fast.) If you have labs (blood work) drawn today and your tests are completely normal, you will receive your results only by: MyChart Message (if you have MyChart) OR A paper copy in the mail If you have any lab test that is abnormal or we need to change your treatment, we will call you to review the results.  Testing/Procedures: Your physician has requested that you have an echocardiogram. Echocardiography is a painless test that uses sound waves to create images of your heart. It provides your doctor with information about the size and shape of your heart and how well your heart's chambers and valves are working. This procedure takes approximately one hour. There are no restrictions for this procedure. Please do NOT wear cologne, perfume, aftershave, or lotions (deodorant is allowed). Please arrive 15 minutes prior to your appointment time.  Follow-Up: At [facility], you and your health needs are our priority.  As part of our continuing mission to provide you with exceptional heart care, we have created designated Provider Care Teams.  These Care Teams include your primary Cardiologist (physician) and Advanced Practice Providers (APPs -  Physician Assistants and Nurse Practitioners) who all work together to provide you with the care you need, when you need it.  Your next appointment:   4 month(s)  The format for your next appointment:   In Person  Provider:   Jari Favre, PA-C, Ronie Spies, PA-C, Robin Searing, NP, or Tereso Newcomer, PA-C

## 2023-04-17 ENCOUNTER — Ambulatory Visit: Payer: BC Managed Care – PPO | Attending: Cardiology

## 2023-06-06 ENCOUNTER — Encounter (HOSPITAL_COMMUNITY): Payer: Self-pay

## 2023-06-06 ENCOUNTER — Emergency Department (HOSPITAL_COMMUNITY)
Admission: EM | Admit: 2023-06-06 | Discharge: 2023-06-06 | Disposition: A | Discharge status: Home / Self Care | Payer: 59 | Attending: Student | Admitting: Student

## 2023-06-06 ENCOUNTER — Emergency Department (HOSPITAL_COMMUNITY): Payer: 59

## 2023-06-06 ENCOUNTER — Other Ambulatory Visit: Payer: Self-pay

## 2023-06-06 DIAGNOSIS — J45909 Unspecified asthma, uncomplicated: Secondary | ICD-10-CM | POA: Diagnosis not present

## 2023-06-06 DIAGNOSIS — K8020 Calculus of gallbladder without cholecystitis without obstruction: Secondary | ICD-10-CM | POA: Insufficient documentation

## 2023-06-06 DIAGNOSIS — R109 Unspecified abdominal pain: Secondary | ICD-10-CM | POA: Diagnosis present

## 2023-06-06 LAB — COMPREHENSIVE METABOLIC PANEL
ALT: 23 U/L (ref 0–44)
AST: 23 U/L (ref 15–41)
Albumin: 3.7 g/dL (ref 3.5–5.0)
Alkaline Phosphatase: 60 U/L (ref 38–126)
Anion gap: 7 (ref 5–15)
BUN: 13 mg/dL (ref 6–20)
CO2: 22 mmol/L (ref 22–32)
Calcium: 9 mg/dL (ref 8.9–10.3)
Chloride: 106 mmol/L (ref 98–111)
Creatinine, Ser: 0.72 mg/dL (ref 0.44–1.00)
GFR, Estimated: >60 mL/min (ref >60)
Glucose, Bld: 100 mg/dL — ABNORMAL HIGH (ref 70–99)
Potassium: 4 mmol/L (ref 3.5–5.1)
Sodium: 135 mmol/L (ref 135–145)
Total Bilirubin: 0.8 mg/dL (ref <1.2)
Total Protein: 7.8 g/dL (ref 6.5–8.1)

## 2023-06-06 LAB — CBC
HCT: 42.2 % (ref 36.0–46.0)
Hemoglobin: 13.2 g/dL (ref 12.0–15.0)
MCH: 28.7 pg (ref 26.0–34.0)
MCHC: 31.3 g/dL (ref 30.0–36.0)
MCV: 91.7 fL (ref 80.0–100.0)
Platelets: 288 10*3/uL (ref 150–400)
RBC: 4.6 MIL/uL (ref 3.87–5.11)
RDW: 13.9 % (ref 11.5–15.5)
WBC: 8.1 10*3/uL (ref 4.0–10.5)
nRBC: 0 % (ref 0.0–0.2)

## 2023-06-06 LAB — HCG, SERUM, QUALITATIVE: Preg, Serum: NEGATIVE

## 2023-06-06 LAB — LIPASE, BLOOD: Lipase: 36 U/L (ref 11–51)

## 2023-06-06 MED ORDER — OXYCODONE-ACETAMINOPHEN 5-325 MG PO TABS: 1.0000 | ORAL_TABLET | Freq: Four times a day (QID) | ORAL | 0 refills | Status: AC | PRN

## 2023-06-06 MED ORDER — IOHEXOL 300 MG/ML  SOLN
100.0000 mL | Freq: Once | INTRAMUSCULAR | Status: AC | PRN
  Administered 2023-06-06: 100 mL via INTRAVENOUS

## 2023-06-06 MED ORDER — SODIUM CHLORIDE 0.9 % IV BOLUS
1000.0000 mL | Freq: Once | INTRAVENOUS | Status: AC
  Administered 2023-06-06: 1000 mL via INTRAVENOUS

## 2023-06-06 MED ORDER — NAPROXEN 375 MG PO TABS: 375.0000 mg | ORAL_TABLET | Freq: Two times a day (BID) | ORAL | 0 refills | Status: DC

## 2023-06-06 MED ORDER — ONDANSETRON 8 MG PO TBDP: 8.0000 mg | ORAL_TABLET | Freq: Three times a day (TID) | ORAL | 0 refills | Status: AC | PRN

## 2023-06-06 MED ORDER — ONDANSETRON HCL 4 MG/2ML IJ SOLN
4.0000 mg | Freq: Once | INTRAMUSCULAR | Status: AC
  Administered 2023-06-06: 4 mg via INTRAVENOUS
  Filled 2023-06-06: qty 2

## 2023-06-06 MED ORDER — MORPHINE SULFATE (PF) 4 MG/ML IV SOLN
4.0000 mg | Freq: Once | INTRAVENOUS | Status: AC
  Administered 2023-06-06: 4 mg via INTRAVENOUS
  Filled 2023-06-06: qty 1

## 2023-06-06 NOTE — ED Triage Notes (Addendum)
Sudden onset of upper abd pain that started 30 mins pta while sitting at computer at work with n/v.  Pain improved after emesis and with pressure.

## 2023-06-06 NOTE — ED Provider Notes (Signed)
[place] [facility]ERGENCY DEPARTMENT AT [facility] Provider Note  CSN: [id number] Arrival date & time: 06/06/23 1234  Chief Complaint(s) Abdominal Pain  HPI Kathleen Montoya is a 42 y.o. female with PMH HLD, PCOS, PFO, obesity who presents Emergency Department for evaluation of abdominal pain nausea and vomiting.  States that symptoms began abruptly 30 minutes prior to arrival with sudden onset epigastric pain nausea and vomiting.  Patient arrives with active vomiting but is denying any chest pain, shortness of breath, headache, fever or other systemic symptoms.  Family accompanies the patient and states that this presentation is similar to the patient's mother when she had gallstones.  No history of prior surgeries in the abdomen.   Past Medical History Past Medical History:  Diagnosis Date   Childhood asthma    History of depression    In college   History of HPV infection    Hypercholesteremia    Insomnia    Low back pain    Migraines    Ovarian cyst    Patent foramen ovale    Patent foramen ovale    Pneumonia 05/2005   Polycystic ovarian disease    Tachycardia    Vitamin D deficiency    Patient Active Problem List   Diagnosis Date Noted   Morbid obesity (HCC) 12/25/2021   Palpitations 12/25/2021   Vestibular dysfunction 12/25/2021   Home Medication(s) Prior to Admission medications   Medication Sig Start Date End Date Taking? Authorizing Provider  naproxen (NAPROSYN) 375 MG tablet Take 1 tablet (375 mg total) by mouth 2 (two) times daily. 06/06/23  Yes Linwood Dibbles, MD  ondansetron (ZOFRAN-ODT) 8 MG disintegrating tablet Take 1 tablet (8 mg total) by mouth every 8 (eight) hours as needed for nausea or vomiting. 06/06/23  Yes Linwood Dibbles, MD  oxyCODONE-acetaminophen (PERCOCET/ROXICET) 5-325 MG tablet Take 1 tablet by mouth every 6 (six) hours as needed for severe pain (pain score 7-10). 06/06/23  Yes Linwood Dibbles, MD  albuterol (VENTOLIN HFA) 108 (90 Base) MCG/ACT  inhaler INHALE 1 PUFF BY MOUTH AS DIRECTED Patient not taking: Reported on 04/01/2023 02/04/20   Jake Bathe, MD  amoxicillin-clavulanate (AUGMENTIN) 875-125 MG tablet Take 1 tablet by mouth every 12 (twelve) hours. Patient not taking: Reported on 04/01/2023 10/25/22   Jeanelle Malling, PA  Cholecalciferol (VITAMIN D) 125 MCG (5000 UT) CAPS Take 1 capsule by mouth daily.    [provider]  diclofenac Sodium (VOLTAREN) 1 % GEL Apply 4 g topically 4 (four) times daily. Patient not taking: Reported on 12/25/2021 10/19/20   Gailen Shelter, PA  etonogestrel-ethinyl estradiol (NUVARING) 0.12-0.015 MG/24HR vaginal ring Place 1 each vaginally every 28 (twenty-eight) days. 1 ring leave in place for 3 weeks, remove, and replace with a new ring after 7 day break Patient not taking: Reported on 12/25/2021 10/03/20   [provider]  HYDROcodone-acetaminophen (NORCO/VICODIN) 5-325 MG tablet Take 1 tablet by mouth every 6 (six) hours as needed for pain. Patient not taking: Reported on 12/25/2021 10/16/20   [provider]  methocarbamol (ROBAXIN) 750 MG tablet Take 750 mg by mouth 3 (three) times daily. Patient not taking: Reported on 12/25/2021 10/16/20   [provider]  metoprolol succinate (TOPROL-XL) 50 MG 24 hr tablet Take with or immediately following a meal. Patient not taking: Reported on 04/01/2023 04/01/22   Jake Bathe, MD  Multiple Vitamin (MULTIVITAMIN) capsule Take 1 capsule by mouth daily.    [provider]  naratriptan (AMERGE) 2.5 MG  tablet Take 2.5 mg by mouth as needed for migraine. Patient not taking: Reported on 04/01/2023 05/19/14   [provider]  omeprazole (PRILOSEC OTC) 20 MG tablet Take 20 mg by mouth every other day.    [provider]  predniSONE (DELTASONE) 20 MG tablet Take 20-60 mg by mouth every other day. Take 3 tablets on day 1-3, take 2 tablets on day 4-6, take 1 tablet on day 7-9. Patient not taking: Reported on 12/25/2021  10/16/20   [provider]  promethazine (PHENERGAN) 25 MG tablet Take 25 mg by mouth every 6 (six) hours as needed for nausea.    [provider]  rizatriptan (MAXALT) 10 MG tablet Take 10 mg by mouth as needed for migraine.    [provider]  spironolactone (ALDACTONE) 25 MG tablet Take 1 tablet (25 mg total) by mouth daily. 04/01/23   Jake Bathe, MD  Ubrogepant (UBRELVY) 100 MG TABS Take 100 mg by mouth as needed (migraine).    [provider]                                                                                                                                    Past Surgical History Past Surgical History:  Procedure Laterality Date   TONSILLECTOMY     WISDOM TOOTH EXTRACTION     Family History History reviewed. No pertinent family history.  Social History Social History   Tobacco Use   Smoking status: Never   Smokeless tobacco: Never  Vaping Use   Vaping status: Never Used  Substance Use Topics   Alcohol use: Yes    Comment: occ   Drug use: No   Allergies Clindamycin, Topiramate, Amitriptyline, Clindamycin/lincomycin, Erythromycin, Loestrin [norethindrone acet-ethinyl est], Maxalt [rizatriptan benzoate], Norethindrone-eth estradiol, Nortriptyline hcl, Sumatriptan, and Zomig [zolmitriptan]  Review of Systems Review of Systems  Gastrointestinal:  Positive for abdominal pain, nausea and vomiting.    Physical Exam Vital Signs  I have reviewed the triage vital signs BP 122/63 (BP Location: Right Arm)   Pulse 80   Temp 98.1 F (36.7 C) (Oral)   Resp 18   Ht 5\' 9"  (1.753 m)   Wt (!) 170 kg   LMP 05/21/2023   SpO2 96%   BMI 55.35 kg/m   Physical Exam Vitals and nursing note reviewed.  Constitutional:      General: She is not in acute distress.    Appearance: She is well-developed.  HENT:     Head: Normocephalic and atraumatic.  Eyes:     Conjunctiva/sclera: Conjunctivae normal.  Cardiovascular:     Rate and  Rhythm: Normal rate and regular rhythm.     Heart sounds: No murmur heard. Pulmonary:     Effort: Pulmonary effort is normal. No respiratory distress.     Breath sounds: Normal breath sounds.  Abdominal:     Palpations: Abdomen is soft.     Tenderness: There  is abdominal tenderness in the right upper quadrant and epigastric area.  Musculoskeletal:        General: No swelling.     Cervical back: Neck supple.  Skin:    General: Skin is warm and dry.     Capillary Refill: Capillary refill takes less than 2 seconds.  Neurological:     Mental Status: She is alert.  Psychiatric:        Mood and Affect: Mood normal.     ED Results and Treatments Labs (all labs ordered are listed, but only abnormal results are displayed) Labs Reviewed  COMPREHENSIVE METABOLIC PANEL - Abnormal; Notable for the following components:      Result Value   Glucose, Bld 100 (*)    All other components within normal limits  LIPASE, BLOOD  CBC  HCG, SERUM, QUALITATIVE  URINALYSIS, ROUTINE W REFLEX MICROSCOPIC                                                                                                                          Radiology US Abdomen Limited RUQ (LIVER/GB)  Result Date: 06/06/2023 CLINICAL DATA:  Right upper quadrant pain EXAM: ULTRASOUND ABDOMEN LIMITED RIGHT UPPER QUADRANT COMPARISON:  CT of earlier today FINDINGS: Gallbladder: 3.6 cm gallstone. No wall thickening or pericholecystic fluid. Sonographic Murphy's sign was not elicited. Common bile duct: Diameter: Normal, 3 mm. Liver: No focal lesion identified. Within normal limits in parenchymal echogenicity. Portal vein is patent on color Doppler imaging with normal direction of blood flow towards the liver. Other: None. IMPRESSION: Cholelithiasis without acute cholecystitis. Electronically Signed   By: Jeronimo Greaves M.D.   On: 06/06/2023 18:12   CT ABDOMEN PELVIS W CONTRAST  Result Date: 06/06/2023 CLINICAL DATA:  Sudden onset epigastric  pain. Pain improved with emesis EXAM: CT ABDOMEN AND PELVIS WITH CONTRAST TECHNIQUE: Multidetector CT imaging of the abdomen and pelvis was performed using the standard protocol following bolus administration of intravenous contrast. RADIATION DOSE REDUCTION: This exam was performed according to the departmental dose-optimization program which includes automated exposure control, adjustment of the mA and/or kV according to patient size and/or use of iterative reconstruction technique. CONTRAST:  OMNIPAQUE IOHEXOL 300 MG/ML  SOLN COMPARISON:  CT 01/31/2013. FINDINGS: Lower chest: Some breathing motion along the lung bases. No pleural effusion at the bases. Small hiatal hernia. Hepatobiliary: Diffuse fatty liver infiltration. The liver is slightly enlarged as well. Patent portal vein distended gallbladder with stone towards the neck. No obvious inflammatory stranding at this time. Pancreas: Unremarkable. No pancreatic ductal dilatation or surrounding inflammatory changes. Spleen: Normal in size without focal abnormality. Adrenals/Urinary Tract: Adrenal glands are unremarkable. Kidneys are normal, without renal calculi, focal lesion, or hydronephrosis. Bladder is unremarkable. Stomach/Bowel: Scattered colonic stool. On this non oral contrast exam large bowel has a normal course and caliber. Normal appendix in the right hemipelvis. Stomach and small bowel are nondilated. Vascular/Lymphatic: No significant vascular findings are present. No enlarged abdominal or pelvic lymph nodes.  Reproductive: Uterus and bilateral adnexa are unremarkable. Other: No free intra-air or free fluid. There are midline anterior abdominal wall epigastric fat containing hernias. Two such foci identified. Larger focus is seen on axial image 39 and sagittal image 127. No bowel involvement. Musculoskeletal: No acute or significant osseous findings. IMPRESSION: No bowel obstruction, free air or free fluid. Scattered stool. Normal appendix.  Distended gallbladder with a stone towards the neck. Please correlate with symptoms and if needed further workup with ultrasound. Hepatomegaly with fatty infiltration. Small hiatal hernia. Two adjacent midline anterior abdominal wall epigastric fat containing hernias. Larger is a moderate-sized hernia. No bowel involvement. Electronically Signed   By: Karen Kays M.D.   On: 06/06/2023 16:31    Pertinent labs & imaging results that were available during my care of the patient were reviewed by me and considered in my medical decision making (see MDM for details).  Medications Ordered in ED Medications  morphine (PF) 4 MG/ML injection 4 mg (4 mg Intravenous Given 06/06/23 1316)  ondansetron (ZOFRAN) injection 4 mg (4 mg Intravenous Given 06/06/23 1316)  sodium chloride 0.9 % bolus 1,000 mL (0 mLs Intravenous Stopped 06/06/23 1912)  iohexol (OMNIPAQUE) 300 MG/ML solution 100 mL (100 mLs Intravenous Contrast Given 06/06/23 1523)                                                                                                                                     Procedures Procedures  (including critical care time)  Medical Decision Making / ED Course   This patient presents to the ED for concern of abdominal pain, this involves an extensive number of treatment options, and is a complaint that carries with it a high risk of complications and morbidity.  The differential diagnosis includes cholecystitis, cholelithiasis/biliary colic, choledocholithiasis, ascending cholangitis, acute hepatitis, pancreatitis, GERD, pneumonia, constipation, nephrolithiasis  MDM: Patient seen emergency room for evaluation of abdominal pain nausea and vomiting.  Physical exam with significant tenderness in the epigastrium and right upper quadrant but is otherwise unremarkable.  Laboratory evaluation is unremarkable including negative LFTs and bilirubin.  Patient pain controlled and fluid resuscitated and on reevaluation  symptoms are improving.  CT abdomen pelvis with a distended gallbladder with a stone towards the neck.  At time of signout, patient pending right upper quadrant ultrasound.  If symptoms remain improved likely could be seen in the outpatient setting for outpatient cholecystectomy.  If pain and vomiting persistent.  May require hospitalization.  Please see provider signout note for continuation of workup.   Additional history obtained: -Additional history obtained from family -External records from outside source obtained and reviewed including: Chart review including previous notes, labs, imaging, consultation notes   Lab Tests: -I ordered, reviewed, and interpreted labs.   The pertinent results include:   Labs Reviewed  COMPREHENSIVE METABOLIC PANEL - Abnormal; Notable for the following components:      Result Value   Glucose,  Bld 100 (*)    All other components within normal limits  LIPASE, BLOOD  CBC  HCG, SERUM, QUALITATIVE  URINALYSIS, ROUTINE W REFLEX MICROSCOPIC    Imaging Studies ordered: I ordered imaging studies including CT abdomen pelvis  I independently visualized and interpreted imaging. I agree with the radiologist interpretation  RUQ pending   Medicines ordered and prescription drug management: Meds ordered this encounter  Medications   morphine (PF) 4 MG/ML injection 4 mg   ondansetron (ZOFRAN) injection 4 mg   sodium chloride 0.9 % bolus 1,000 mL   iohexol (OMNIPAQUE) 300 MG/ML solution 100 mL   naproxen (NAPROSYN) 375 MG tablet    Sig: Take 1 tablet (375 mg total) by mouth 2 (two) times daily.    Dispense:  20 tablet    Refill:  0   oxyCODONE-acetaminophen (PERCOCET/ROXICET) 5-325 MG tablet    Sig: Take 1 tablet by mouth every 6 (six) hours as needed for severe pain (pain score 7-10).    Dispense:  12 tablet    Refill:  0   ondansetron (ZOFRAN-ODT) 8 MG disintegrating tablet    Sig: Take 1 tablet (8 mg total) by mouth every 8 (eight) hours as needed for  nausea or vomiting.    Dispense:  12 tablet    Refill:  0    -I have reviewed the patients home medicines and have made adjustments as needed  Critical interventions none   Cardiac Monitoring: The patient was maintained on a cardiac monitor.  I personally viewed and interpreted the cardiac monitored which showed an underlying rhythm of: NSR  Social Determinants of Health:  Factors impacting patients care include: none   Reevaluation: After the interventions noted above, I reevaluated the patient and found that they have :improved  Co morbidities that complicate the patient evaluation  Past Medical History:  Diagnosis Date   Childhood asthma    History of depression    In college   History of HPV infection    Hypercholesteremia    Insomnia    Low back pain    Migraines    Ovarian cyst    Patent foramen ovale    Patent foramen ovale    Pneumonia 05/2005   Polycystic ovarian disease    Tachycardia    Vitamin D deficiency       Dispostion: I considered admission for this patient, but disposition pending completion of imaging studies.  Please see provider signout continuation of workup     Final Clinical Impression(s) / ED Diagnoses Final diagnoses:  Gallstones     @PCDICTATION @    Glendora Score, MD 06/06/23 2114

## 2023-06-06 NOTE — Discharge Instructions (Addendum)
Take the medications as needed for pain if you have recurrent episodes.  Try to eat a low-fat diet.  Follow-up with a general surgeon to discuss further treatment of your gallstones.  Return to the ER you have severe pain, uncontrolled vomiting or fevers  The gallstone was 3.6 cm on your ultrasound

## 2023-06-06 NOTE — ED Provider Notes (Signed)
Patient was initially seen by Dr. Posey Rea.  Please see his note.  Patient presented to the ED for evaluation of upper abdominal pain.  Patient had associated nausea and vomiting  Patient's laboratory test did not show any signs of leukocytosis.  No signs of hepatitis or pancreatitis.  Patient's ultrasound does show evidence of cholelithiasis but no cholecystitis  Patient states she is feeling better.  She is hungry.  She appears stable for discharge with outpatient general surgery follow-up   Linwood Dibbles, MD 06/06/23 1909

## 2023-06-11 ENCOUNTER — Other Ambulatory Visit: Payer: Self-pay | Admitting: Cardiology

## 2023-08-04 ENCOUNTER — Ambulatory Visit: Payer: 59 | Attending: Physician Assistant | Admitting: Physician Assistant

## 2023-08-04 NOTE — Progress Notes (Deleted)
  Cardiology Office Note:  .   Date:  08/04/2023  ID:  Kathleen Montoya, DOB Nov 08, 1980, MRN [id number] PCP: Laurann Montana, MD  [facility] Providers Cardiologist:  Donato Schultz, MD {  History of Present Illness: .   Kathleen Montoya is a 43 y.o. female ***  ROS: ***  Studies Reviewed: .        *** Risk Assessment/Calculations:   {Does this patient have ATRIAL FIBRILLATION?:[phone]} No BP recorded.  {Refresh Note OR Click here to enter BP  :1}***       Physical Exam:   VS:  There were no vitals taken for this visit.   Wt Readings from Last 3 Encounters:  06/06/23 (!) 374 lb 12.5 oz (170 kg)  04/01/23 (!) 375 lb (170.1 kg)  10/25/22 (!) 354 lb 15.1 oz (161 kg)    GEN: Well nourished, well developed in no acute distress NECK: No JVD; No carotid bruits CARDIAC: ***RRR, no murmurs, rubs, gallops RESPIRATORY:  Clear to auscultation without rales, wheezing or rhonchi  ABDOMEN: Soft, non-tender, non-distended EXTREMITIES:  No edema; No deformity   ASSESSMENT AND PLAN: .   ***    {Are you ordering a CV Procedure (e.g. stress test, cath, DCCV, TEE, etc)?   Press F2        :[id number]}  Dispo: ***  Signed, Sharlene Dory, PA-C

## 2023-08-07 ENCOUNTER — Emergency Department (HOSPITAL_COMMUNITY): Payer: 59

## 2023-08-07 ENCOUNTER — Observation Stay (HOSPITAL_COMMUNITY)
Admission: EM | Admit: 2023-08-07 | Discharge: 2023-08-09 | Disposition: A | Discharge status: Home / Self Care | Payer: 59 | Attending: Family Medicine | Admitting: Family Medicine

## 2023-08-07 DIAGNOSIS — K760 Fatty (change of) liver, not elsewhere classified: Secondary | ICD-10-CM | POA: Insufficient documentation

## 2023-08-07 DIAGNOSIS — Z889 Allergy status to unspecified drugs, medicaments and biological substances status: Secondary | ICD-10-CM

## 2023-08-07 DIAGNOSIS — I5042 Chronic combined systolic (congestive) and diastolic (congestive) heart failure: Secondary | ICD-10-CM | POA: Diagnosis not present

## 2023-08-07 DIAGNOSIS — K219 Gastro-esophageal reflux disease without esophagitis: Secondary | ICD-10-CM | POA: Diagnosis present

## 2023-08-07 DIAGNOSIS — G4700 Insomnia, unspecified: Secondary | ICD-10-CM | POA: Diagnosis not present

## 2023-08-07 DIAGNOSIS — K8012 Calculus of gallbladder with acute and chronic cholecystitis without obstruction: Secondary | ICD-10-CM | POA: Diagnosis not present

## 2023-08-07 DIAGNOSIS — E8889 Other specified metabolic disorders: Secondary | ICD-10-CM | POA: Diagnosis not present

## 2023-08-07 DIAGNOSIS — J45909 Unspecified asthma, uncomplicated: Secondary | ICD-10-CM | POA: Diagnosis not present

## 2023-08-07 DIAGNOSIS — K436 Other and unspecified ventral hernia with obstruction, without gangrene: Secondary | ICD-10-CM | POA: Insufficient documentation

## 2023-08-07 DIAGNOSIS — K8000 Calculus of gallbladder with acute cholecystitis without obstruction: Principal | ICD-10-CM

## 2023-08-07 DIAGNOSIS — R000 Tachycardia, unspecified: Secondary | ICD-10-CM | POA: Diagnosis present

## 2023-08-07 DIAGNOSIS — Z79899 Other long term (current) drug therapy: Secondary | ICD-10-CM | POA: Insufficient documentation

## 2023-08-07 DIAGNOSIS — R7989 Other specified abnormal findings of blood chemistry: Secondary | ICD-10-CM | POA: Diagnosis present

## 2023-08-07 DIAGNOSIS — E782 Mixed hyperlipidemia: Secondary | ICD-10-CM | POA: Insufficient documentation

## 2023-08-07 DIAGNOSIS — K8042 Calculus of bile duct with acute cholecystitis without obstruction: Secondary | ICD-10-CM | POA: Diagnosis present

## 2023-08-07 DIAGNOSIS — R002 Palpitations: Secondary | ICD-10-CM | POA: Diagnosis not present

## 2023-08-07 DIAGNOSIS — E7800 Pure hypercholesterolemia, unspecified: Secondary | ICD-10-CM | POA: Diagnosis present

## 2023-08-07 DIAGNOSIS — R112 Nausea with vomiting, unspecified: Secondary | ICD-10-CM | POA: Diagnosis present

## 2023-08-07 DIAGNOSIS — K439 Ventral hernia without obstruction or gangrene: Secondary | ICD-10-CM | POA: Diagnosis present

## 2023-08-07 DIAGNOSIS — K812 Acute cholecystitis with chronic cholecystitis: Secondary | ICD-10-CM | POA: Diagnosis present

## 2023-08-07 DIAGNOSIS — R17 Unspecified jaundice: Secondary | ICD-10-CM

## 2023-08-07 LAB — COMPREHENSIVE METABOLIC PANEL
ALT: 20 U/L (ref 0–44)
AST: 26 U/L (ref 15–41)
Albumin: 4.1 g/dL (ref 3.5–5.0)
Alkaline Phosphatase: 63 U/L (ref 38–126)
Anion gap: 12 (ref 5–15)
BUN: 18 mg/dL (ref 6–20)
CO2: 19 mmol/L — ABNORMAL LOW (ref 22–32)
Calcium: 8.7 mg/dL — ABNORMAL LOW (ref 8.9–10.3)
Chloride: 102 mmol/L (ref 98–111)
Creatinine, Ser: 0.87 mg/dL (ref 0.44–1.00)
GFR, Estimated: >60 mL/min (ref >60)
Glucose, Bld: 106 mg/dL — ABNORMAL HIGH (ref 70–99)
Potassium: 4.5 mmol/L (ref 3.5–5.1)
Sodium: 133 mmol/L — ABNORMAL LOW (ref 135–145)
Total Bilirubin: 1.8 mg/dL — ABNORMAL HIGH (ref 0.0–1.2)
Total Protein: 8.4 g/dL — ABNORMAL HIGH (ref 6.5–8.1)

## 2023-08-07 LAB — URINALYSIS, ROUTINE W REFLEX MICROSCOPIC
Bilirubin Urine: NEGATIVE
Glucose, UA: NEGATIVE mg/dL
Hgb urine dipstick: NEGATIVE
Ketones, ur: NEGATIVE mg/dL
Leukocytes,Ua: NEGATIVE
Nitrite: NEGATIVE
Protein, ur: NEGATIVE mg/dL
Specific Gravity, Urine: 1.028 (ref 1.005–1.030)
pH: 5 (ref 5.0–8.0)

## 2023-08-07 LAB — CBC WITH DIFFERENTIAL/PLATELET
Abs Immature Granulocytes: 0.02 10*3/uL (ref 0.00–0.07)
Basophils Absolute: 0 10*3/uL (ref 0.0–0.1)
Basophils Relative: 0 %
Eosinophils Absolute: 0 10*3/uL (ref 0.0–0.5)
Eosinophils Relative: 0 %
HCT: 42.3 % (ref 36.0–46.0)
Hemoglobin: 13.9 g/dL (ref 12.0–15.0)
Immature Granulocytes: 0 %
Lymphocytes Relative: 7 %
Lymphs Abs: 0.6 10*3/uL — ABNORMAL LOW (ref 0.7–4.0)
MCH: 28.8 pg (ref 26.0–34.0)
MCHC: 32.9 g/dL (ref 30.0–36.0)
MCV: 87.8 fL (ref 80.0–100.0)
Monocytes Absolute: 0.4 10*3/uL (ref 0.1–1.0)
Monocytes Relative: 4 %
Neutro Abs: 7.9 10*3/uL — ABNORMAL HIGH (ref 1.7–7.7)
Neutrophils Relative %: 89 %
Platelets: 308 10*3/uL (ref 150–400)
RBC: 4.82 MIL/uL (ref 3.87–5.11)
RDW: 13.7 % (ref 11.5–15.5)
WBC: 9 10*3/uL (ref 4.0–10.5)
nRBC: 0 % (ref 0.0–0.2)

## 2023-08-07 LAB — LIPASE, BLOOD: Lipase: 28 U/L (ref 11–51)

## 2023-08-07 LAB — HCG, SERUM, QUALITATIVE: Preg, Serum: NEGATIVE

## 2023-08-07 MED ORDER — ONDANSETRON 4 MG PO TBDP
4.0000 mg | ORAL_TABLET | Freq: Once | ORAL | Status: AC
  Administered 2023-08-07: 4 mg via ORAL
  Filled 2023-08-07: qty 1

## 2023-08-07 MED ORDER — ACETAMINOPHEN 500 MG PO TABS
1000.0000 mg | ORAL_TABLET | Freq: Once | ORAL | Status: AC
  Administered 2023-08-07: 1000 mg via ORAL
  Filled 2023-08-07: qty 2

## 2023-08-07 NOTE — ED Triage Notes (Signed)
 Patient has hx of gallstones. States she is having gallbladder attack. Complains of abdominal pain, nausea, and vomiting. Has taken Zofran  x 3 today.

## 2023-08-07 NOTE — ED Provider Triage Note (Signed)
 Emergency Medicine Provider Triage Evaluation Note  Kathleen Montoya , a 43 y.o. female  was evaluated in triage.  Pt complains of upper abd pain, nausea vomiting and developed a fever today.  No CP.  Hx of gallstones. No urinary sx.   Review of Systems  Positive: Abd pain Negative: Fever   Physical Exam  BP 92/66 (BP Location: Left Arm)   Pulse (!) 123   Temp (!) 100.6 F (38.1 C) (Oral)   Resp (!) 23   SpO2 95%  Gen:   Awake, no distress   Resp:  Normal effort  MSK:   Moves extremities without difficulty  Other:  Uncomfortable, obese, Epigastric TTP  Medical Decision Making  Medically screening exam initiated at 10:13 PM.  Appropriate orders placed.  Kathleen Montoya was informed that the remainder of the evaluation will be completed by another provider, this initial triage assessment does not replace that evaluation, and the importance of remaining in the ED until their evaluation is complete.  Labs, RUQ ultrasound   Kathleen Montoya, [place] 08/07/23 2215

## 2023-08-08 ENCOUNTER — Inpatient Hospital Stay (HOSPITAL_COMMUNITY): Payer: 59 | Admitting: Anesthesiology

## 2023-08-08 ENCOUNTER — Encounter (HOSPITAL_COMMUNITY)
Admission: EM | Disposition: A | Discharge status: Home / Self Care | Payer: Self-pay | Source: Home / Self Care | Attending: Family Medicine

## 2023-08-08 ENCOUNTER — Other Ambulatory Visit: Payer: Self-pay

## 2023-08-08 ENCOUNTER — Encounter (HOSPITAL_COMMUNITY): Payer: Self-pay | Admitting: Surgery

## 2023-08-08 ENCOUNTER — Inpatient Hospital Stay (HOSPITAL_BASED_OUTPATIENT_CLINIC_OR_DEPARTMENT_OTHER): Payer: 59 | Admitting: Anesthesiology

## 2023-08-08 ENCOUNTER — Inpatient Hospital Stay (HOSPITAL_BASED_OUTPATIENT_CLINIC_OR_DEPARTMENT_OTHER): Payer: 59

## 2023-08-08 ENCOUNTER — Emergency Department (HOSPITAL_COMMUNITY): Payer: 59

## 2023-08-08 ENCOUNTER — Inpatient Hospital Stay (HOSPITAL_COMMUNITY): Payer: 59

## 2023-08-08 DIAGNOSIS — Q2112 Patent foramen ovale: Secondary | ICD-10-CM | POA: Diagnosis not present

## 2023-08-08 DIAGNOSIS — G4700 Insomnia, unspecified: Secondary | ICD-10-CM | POA: Diagnosis present

## 2023-08-08 DIAGNOSIS — K8042 Calculus of bile duct with acute cholecystitis without obstruction: Secondary | ICD-10-CM | POA: Diagnosis present

## 2023-08-08 DIAGNOSIS — Z889 Allergy status to unspecified drugs, medicaments and biological substances status: Secondary | ICD-10-CM

## 2023-08-08 DIAGNOSIS — R17 Unspecified jaundice: Secondary | ICD-10-CM

## 2023-08-08 DIAGNOSIS — Z01810 Encounter for preprocedural cardiovascular examination: Secondary | ICD-10-CM

## 2023-08-08 DIAGNOSIS — K8064 Calculus of gallbladder and bile duct with chronic cholecystitis without obstruction: Secondary | ICD-10-CM | POA: Diagnosis not present

## 2023-08-08 DIAGNOSIS — J45909 Unspecified asthma, uncomplicated: Secondary | ICD-10-CM

## 2023-08-08 DIAGNOSIS — Z8669 Personal history of other diseases of the nervous system and sense organs: Secondary | ICD-10-CM | POA: Insufficient documentation

## 2023-08-08 DIAGNOSIS — R000 Tachycardia, unspecified: Secondary | ICD-10-CM

## 2023-08-08 DIAGNOSIS — K439 Ventral hernia without obstruction or gangrene: Secondary | ICD-10-CM | POA: Diagnosis present

## 2023-08-08 DIAGNOSIS — I5042 Chronic combined systolic (congestive) and diastolic (congestive) heart failure: Secondary | ICD-10-CM | POA: Diagnosis not present

## 2023-08-08 DIAGNOSIS — R7989 Other specified abnormal findings of blood chemistry: Secondary | ICD-10-CM

## 2023-08-08 DIAGNOSIS — K760 Fatty (change of) liver, not elsewhere classified: Secondary | ICD-10-CM | POA: Insufficient documentation

## 2023-08-08 DIAGNOSIS — K8000 Calculus of gallbladder with acute cholecystitis without obstruction: Principal | ICD-10-CM

## 2023-08-08 DIAGNOSIS — K812 Acute cholecystitis with chronic cholecystitis: Secondary | ICD-10-CM | POA: Diagnosis present

## 2023-08-08 DIAGNOSIS — E7800 Pure hypercholesterolemia, unspecified: Secondary | ICD-10-CM | POA: Diagnosis present

## 2023-08-08 DIAGNOSIS — I509 Heart failure, unspecified: Secondary | ICD-10-CM | POA: Diagnosis not present

## 2023-08-08 DIAGNOSIS — L0231 Cutaneous abscess of buttock: Secondary | ICD-10-CM | POA: Insufficient documentation

## 2023-08-08 DIAGNOSIS — I5021 Acute systolic (congestive) heart failure: Secondary | ICD-10-CM

## 2023-08-08 DIAGNOSIS — Z8659 Personal history of other mental and behavioral disorders: Secondary | ICD-10-CM | POA: Insufficient documentation

## 2023-08-08 HISTORY — PX: CHOLECYSTECTOMY: SHX55

## 2023-08-08 LAB — ECHOCARDIOGRAM COMPLETE
Area-P 1/2: 3.91 cm2
Calc EF: 64.6 %
Est EF: 55
S' Lateral: 2.9 cm
Single Plane A2C EF: 56.3 %
Single Plane A4C EF: 73.2 %

## 2023-08-08 LAB — HEPATIC FUNCTION PANEL
ALT: 24 U/L (ref 0–44)
AST: 25 U/L (ref 15–41)
Albumin: 3.3 g/dL — ABNORMAL LOW (ref 3.5–5.0)
Alkaline Phosphatase: 51 U/L (ref 38–126)
Bilirubin, Direct: 0.2 mg/dL (ref 0.0–0.2)
Indirect Bilirubin: 0.6 mg/dL (ref 0.3–0.9)
Total Bilirubin: 0.8 mg/dL (ref 0.0–1.2)
Total Protein: 7 g/dL (ref 6.5–8.1)

## 2023-08-08 LAB — CBC
HCT: 39 % (ref 36.0–46.0)
Hemoglobin: 12.2 g/dL (ref 12.0–15.0)
MCH: 29 pg (ref 26.0–34.0)
MCHC: 31.3 g/dL (ref 30.0–36.0)
MCV: 92.9 fL (ref 80.0–100.0)
Platelets: 226 10*3/uL (ref 150–400)
RBC: 4.2 MIL/uL (ref 3.87–5.11)
RDW: 13.9 % (ref 11.5–15.5)
WBC: 6.9 10*3/uL (ref 4.0–10.5)
nRBC: 0 % (ref 0.0–0.2)

## 2023-08-08 LAB — POTASSIUM: Potassium: 3.5 mmol/L (ref 3.5–5.1)

## 2023-08-08 LAB — CREATININE, SERUM
Creatinine, Ser: 0.9 mg/dL (ref 0.44–1.00)
GFR, Estimated: >60 mL/min (ref >60)

## 2023-08-08 SURGERY — LAPAROSCOPIC CHOLECYSTECTOMY WITH INTRAOPERATIVE CHOLANGIOGRAM
Anesthesia: General

## 2023-08-08 MED ORDER — SUCCINYLCHOLINE CHLORIDE 200 MG/10ML IV SOSY
PREFILLED_SYRINGE | INTRAVENOUS | Status: AC
  Filled 2023-08-08: qty 10

## 2023-08-08 MED ORDER — METOPROLOL SUCCINATE ER 50 MG PO TB24
50.0000 mg | ORAL_TABLET | Freq: Every day | ORAL | Status: DC
  Administered 2023-08-08 – 2023-08-09 (×2): 50 mg via ORAL
  Filled 2023-08-08 (×2): qty 1

## 2023-08-08 MED ORDER — DIPHENHYDRAMINE HCL 50 MG/ML IJ SOLN: 12.5000 mg | Freq: Four times a day (QID) | INTRAMUSCULAR | Status: DC | PRN

## 2023-08-08 MED ORDER — BISMUTH SUBSALICYLATE 262 MG/15ML PO SUSP: 30.0000 mL | Freq: Three times a day (TID) | ORAL | Status: DC | PRN

## 2023-08-08 MED ORDER — ONDANSETRON HCL 4 MG/2ML IJ SOLN
4.0000 mg | Freq: Four times a day (QID) | INTRAMUSCULAR | Status: DC | PRN
  Administered 2023-08-08: 4 mg via INTRAVENOUS

## 2023-08-08 MED ORDER — MIDAZOLAM HCL 2 MG/2ML IJ SOLN
INTRAMUSCULAR | Status: AC
  Filled 2023-08-08: qty 2

## 2023-08-08 MED ORDER — SODIUM CHLORIDE 0.45 % IV SOLN: INTRAVENOUS | Status: DC

## 2023-08-08 MED ORDER — LIDOCAINE HCL (PF) 2 % IJ SOLN
INTRAMUSCULAR | Status: AC
  Filled 2023-08-08: qty 5

## 2023-08-08 MED ORDER — SALINE SPRAY 0.65 % NA SOLN: 1.0000 | Freq: Four times a day (QID) | NASAL | Status: DC | PRN

## 2023-08-08 MED ORDER — ENSURE PRE-SURGERY PO LIQD: 296.0000 mL | Freq: Once | ORAL | Status: DC

## 2023-08-08 MED ORDER — ONDANSETRON HCL 4 MG/2ML IJ SOLN
INTRAMUSCULAR | Status: AC
  Filled 2023-08-08: qty 2

## 2023-08-08 MED ORDER — NAPHAZOLINE-GLYCERIN 0.012-0.25 % OP SOLN: 1.0000 [drp] | Freq: Four times a day (QID) | OPHTHALMIC | Status: DC | PRN

## 2023-08-08 MED ORDER — PHENOL 1.4 % MT LIQD: 2.0000 | OROMUCOSAL | Status: DC | PRN

## 2023-08-08 MED ORDER — PROCHLORPERAZINE EDISYLATE 10 MG/2ML IJ SOLN
5.0000 mg | INTRAMUSCULAR | Status: DC | PRN
  Administered 2023-08-08: 5 mg via INTRAVENOUS
  Filled 2023-08-08: qty 2

## 2023-08-08 MED ORDER — ACETAMINOPHEN 500 MG PO TABS
1000.0000 mg | ORAL_TABLET | ORAL | Status: AC
  Administered 2023-08-08: 1000 mg via ORAL
  Filled 2023-08-08: qty 2

## 2023-08-08 MED ORDER — SODIUM CHLORIDE (PF) 0.9 % IJ SOLN
INTRAMUSCULAR | Status: DC | PRN
  Administered 2023-08-08: 13 mL

## 2023-08-08 MED ORDER — MAGIC MOUTHWASH: 15.0000 mL | Freq: Four times a day (QID) | ORAL | Status: DC | PRN

## 2023-08-08 MED ORDER — LACTATED RINGERS IV BOLUS
1000.0000 mL | Freq: Once | INTRAVENOUS | Status: AC
  Administered 2023-08-08: 1000 mL via INTRAVENOUS

## 2023-08-08 MED ORDER — PANTOPRAZOLE SODIUM 40 MG PO TBEC
40.0000 mg | DELAYED_RELEASE_TABLET | Freq: Two times a day (BID) | ORAL | Status: DC
  Administered 2023-08-08 – 2023-08-09 (×2): 40 mg via ORAL
  Filled 2023-08-08 (×2): qty 1

## 2023-08-08 MED ORDER — DEXAMETHASONE SODIUM PHOSPHATE 4 MG/ML IJ SOLN
INTRAMUSCULAR | Status: DC | PRN
  Administered 2023-08-08: 8 mg via INTRAVENOUS

## 2023-08-08 MED ORDER — METOPROLOL TARTRATE 5 MG/5ML IV SOLN: 5.0000 mg | Freq: Four times a day (QID) | INTRAVENOUS | Status: DC | PRN

## 2023-08-08 MED ORDER — BUPIVACAINE-EPINEPHRINE (PF) 0.5% -1:200000 IJ SOLN
INTRAMUSCULAR | Status: AC
  Filled 2023-08-08: qty 30

## 2023-08-08 MED ORDER — ONDANSETRON HCL 4 MG/2ML IJ SOLN
4.0000 mg | Freq: Once | INTRAMUSCULAR | Status: AC
Start: 2023-08-08 — End: 2023-08-08
  Administered 2023-08-08: 4 mg via INTRAVENOUS
  Filled 2023-08-08: qty 2

## 2023-08-08 MED ORDER — FENTANYL CITRATE PF 50 MCG/ML IJ SOSY
PREFILLED_SYRINGE | INTRAMUSCULAR | Status: AC
  Administered 2023-08-08: 50 ug via INTRAVENOUS
  Filled 2023-08-08: qty 2

## 2023-08-08 MED ORDER — KETOROLAC TROMETHAMINE 30 MG/ML IJ SOLN
INTRAMUSCULAR | Status: DC | PRN
  Administered 2023-08-08: 30 mg via INTRAVENOUS

## 2023-08-08 MED ORDER — DEXAMETHASONE SODIUM PHOSPHATE 10 MG/ML IJ SOLN
INTRAMUSCULAR | Status: AC
  Filled 2023-08-08: qty 1

## 2023-08-08 MED ORDER — DROPERIDOL 2.5 MG/ML IJ SOLN: 0.6250 mg | Freq: Once | INTRAMUSCULAR | Status: DC | PRN

## 2023-08-08 MED ORDER — 0.9 % SODIUM CHLORIDE (POUR BTL) OPTIME
TOPICAL | Status: DC | PRN
  Administered 2023-08-08: 1000 mL

## 2023-08-08 MED ORDER — OXYCODONE HCL 5 MG/5ML PO SOLN: 5.0000 mg | Freq: Once | ORAL | Status: DC | PRN

## 2023-08-08 MED ORDER — METRONIDAZOLE 500 MG/100ML IV SOLN
500.0000 mg | Freq: Once | INTRAVENOUS | Status: DC
  Filled 2023-08-08: qty 100

## 2023-08-08 MED ORDER — ACETAMINOPHEN 325 MG PO TABS: 325.0000 mg | ORAL_TABLET | Freq: Four times a day (QID) | ORAL | Status: DC | PRN

## 2023-08-08 MED ORDER — PERFLUTREN LIPID MICROSPHERE
1.0000 mL | INTRAVENOUS | Status: AC | PRN
  Administered 2023-08-08: 1 mL via INTRAVENOUS

## 2023-08-08 MED ORDER — KETOROLAC TROMETHAMINE 30 MG/ML IJ SOLN
INTRAMUSCULAR | Status: AC
  Filled 2023-08-08: qty 1

## 2023-08-08 MED ORDER — ROCURONIUM BROMIDE 100 MG/10ML IV SOLN
INTRAVENOUS | Status: DC | PRN
  Administered 2023-08-08: 20 mg via INTRAVENOUS
  Administered 2023-08-08: 70 mg via INTRAVENOUS

## 2023-08-08 MED ORDER — SODIUM CHLORIDE 0.9 % IV SOLN
2.0000 g | INTRAVENOUS | Status: DC
  Administered 2023-08-09: 2 g via INTRAVENOUS
  Filled 2023-08-08: qty 20

## 2023-08-08 MED ORDER — CALCIUM POLYCARBOPHIL 625 MG PO TABS
625.0000 mg | ORAL_TABLET | Freq: Two times a day (BID) | ORAL | Status: DC
  Administered 2023-08-08 – 2023-08-09 (×4): 625 mg via ORAL
  Filled 2023-08-08 (×4): qty 1

## 2023-08-08 MED ORDER — ALUM & MAG HYDROXIDE-SIMETH 200-200-20 MG/5ML PO SUSP: 30.0000 mL | Freq: Four times a day (QID) | ORAL | Status: DC | PRN

## 2023-08-08 MED ORDER — CHLORHEXIDINE GLUCONATE 0.12 % MT SOLN
15.0000 mL | Freq: Once | OROMUCOSAL | Status: AC
  Administered 2023-08-08: 15 mL via OROMUCOSAL

## 2023-08-08 MED ORDER — PHENYLEPHRINE 80 MCG/ML (10ML) SYRINGE FOR IV PUSH (FOR BLOOD PRESSURE SUPPORT)
PREFILLED_SYRINGE | INTRAVENOUS | Status: AC
  Filled 2023-08-08: qty 30

## 2023-08-08 MED ORDER — PROPOFOL 10 MG/ML IV BOLUS
INTRAVENOUS | Status: DC | PRN
  Administered 2023-08-08: 200 mg via INTRAVENOUS
  Administered 2023-08-08: 30 mg via INTRAVENOUS

## 2023-08-08 MED ORDER — OXYCODONE HCL 5 MG PO TABS: 5.0000 mg | ORAL_TABLET | Freq: Once | ORAL | Status: DC | PRN

## 2023-08-08 MED ORDER — SODIUM CHLORIDE 0.9 % IV BOLUS
1000.0000 mL | Freq: Once | INTRAVENOUS | Status: AC
  Administered 2023-08-08: 1000 mL via INTRAVENOUS

## 2023-08-08 MED ORDER — LIDOCAINE HCL (CARDIAC) PF 100 MG/5ML IV SOSY
PREFILLED_SYRINGE | INTRAVENOUS | Status: DC | PRN
  Administered 2023-08-08: 100 mg via INTRAVENOUS

## 2023-08-08 MED ORDER — SUGAMMADEX SODIUM 200 MG/2ML IV SOLN
INTRAVENOUS | Status: DC | PRN
  Administered 2023-08-08 (×2): 200 mg via INTRAVENOUS

## 2023-08-08 MED ORDER — MIDAZOLAM HCL 2 MG/2ML IJ SOLN
INTRAMUSCULAR | Status: DC | PRN
  Administered 2023-08-08 (×2): 1 mg via INTRAVENOUS

## 2023-08-08 MED ORDER — ACETAMINOPHEN 650 MG RE SUPP: 650.0000 mg | Freq: Four times a day (QID) | RECTAL | Status: DC | PRN

## 2023-08-08 MED ORDER — FENTANYL CITRATE PF 50 MCG/ML IJ SOSY
25.0000 ug | PREFILLED_SYRINGE | INTRAMUSCULAR | Status: DC | PRN
  Administered 2023-08-08: 50 ug via INTRAVENOUS

## 2023-08-08 MED ORDER — METRONIDAZOLE 500 MG/100ML IV SOLN
500.0000 mg | Freq: Two times a day (BID) | INTRAVENOUS | Status: DC
  Administered 2023-08-08 – 2023-08-09 (×3): 500 mg via INTRAVENOUS
  Filled 2023-08-08 (×2): qty 100

## 2023-08-08 MED ORDER — IOHEXOL 300 MG/ML  SOLN
INTRAMUSCULAR | Status: DC | PRN
  Administered 2023-08-08: 10 mL

## 2023-08-08 MED ORDER — ROCURONIUM BROMIDE 10 MG/ML (PF) SYRINGE
PREFILLED_SYRINGE | INTRAVENOUS | Status: AC
  Filled 2023-08-08: qty 10

## 2023-08-08 MED ORDER — BISACODYL 10 MG RE SUPP: 10.0000 mg | Freq: Two times a day (BID) | RECTAL | Status: DC | PRN

## 2023-08-08 MED ORDER — CHLORHEXIDINE GLUCONATE CLOTH 2 % EX PADS: 6.0000 | MEDICATED_PAD | Freq: Once | CUTANEOUS | Status: DC

## 2023-08-08 MED ORDER — HYDROMORPHONE HCL 1 MG/ML IJ SOLN
0.5000 mg | INTRAMUSCULAR | Status: DC | PRN
  Administered 2023-08-08: 2 mg via INTRAVENOUS
  Administered 2023-08-08: 1 mg via INTRAVENOUS
  Administered 2023-08-09: 2 mg via INTRAVENOUS
  Filled 2023-08-08: qty 1
  Filled 2023-08-08 (×2): qty 2

## 2023-08-08 MED ORDER — METHOCARBAMOL 1000 MG/10ML IJ SOLN: 1000.0000 mg | Freq: Four times a day (QID) | INTRAMUSCULAR | Status: DC | PRN

## 2023-08-08 MED ORDER — BUPIVACAINE LIPOSOME 1.3 % IJ SUSP: 20.0000 mL | Freq: Once | INTRAMUSCULAR | Status: DC

## 2023-08-08 MED ORDER — LACTATED RINGERS IV BOLUS: 1000.0000 mL | Freq: Three times a day (TID) | INTRAVENOUS | Status: DC | PRN

## 2023-08-08 MED ORDER — MORPHINE SULFATE (PF) 4 MG/ML IV SOLN
4.0000 mg | INTRAVENOUS | Status: DC | PRN
  Administered 2023-08-08: 4 mg via INTRAVENOUS
  Filled 2023-08-08: qty 1

## 2023-08-08 MED ORDER — BUPIVACAINE-EPINEPHRINE 0.5% -1:200000 IJ SOLN
INTRAMUSCULAR | Status: DC | PRN
  Administered 2023-08-08: 15 mL

## 2023-08-08 MED ORDER — FENTANYL CITRATE PF 50 MCG/ML IJ SOSY
PREFILLED_SYRINGE | INTRAMUSCULAR | Status: AC
  Administered 2023-08-08: 50 ug via INTRAVENOUS
  Filled 2023-08-08: qty 1

## 2023-08-08 MED ORDER — FENTANYL CITRATE (PF) 100 MCG/2ML IJ SOLN
INTRAMUSCULAR | Status: DC | PRN
  Administered 2023-08-08 (×2): 50 ug via INTRAVENOUS

## 2023-08-08 MED ORDER — ACETAMINOPHEN 325 MG PO TABS: 650.0000 mg | ORAL_TABLET | Freq: Four times a day (QID) | ORAL | Status: DC | PRN

## 2023-08-08 MED ORDER — FENTANYL CITRATE (PF) 100 MCG/2ML IJ SOLN
INTRAMUSCULAR | Status: AC
  Filled 2023-08-08: qty 2

## 2023-08-08 MED ORDER — ENOXAPARIN SODIUM 40 MG/0.4ML IJ SOSY: 40.0000 mg | PREFILLED_SYRINGE | INTRAMUSCULAR | Status: DC

## 2023-08-08 MED ORDER — SODIUM CHLORIDE 0.9 % IV SOLN
2.0000 g | Freq: Once | INTRAVENOUS | Status: AC
  Administered 2023-08-08: 2 g via INTRAVENOUS
  Filled 2023-08-08: qty 20

## 2023-08-08 MED ORDER — OXYCODONE HCL 5 MG PO TABS
5.0000 mg | ORAL_TABLET | ORAL | Status: DC | PRN
  Administered 2023-08-09: 10 mg via ORAL

## 2023-08-08 MED ORDER — LACTATED RINGERS IR SOLN
Status: DC | PRN
  Administered 2023-08-08: 1000 mL

## 2023-08-08 MED ORDER — SPIRONOLACTONE 25 MG PO TABS
25.0000 mg | ORAL_TABLET | Freq: Every day | ORAL | Status: DC
  Administered 2023-08-08: 25 mg via ORAL
  Filled 2023-08-08: qty 1

## 2023-08-08 MED ORDER — MENTHOL 3 MG MT LOZG: 1.0000 | LOZENGE | OROMUCOSAL | Status: DC | PRN

## 2023-08-08 MED ORDER — LACTATED RINGERS IV SOLN: INTRAVENOUS | Status: DC

## 2023-08-08 MED ORDER — TRAMADOL HCL 50 MG PO TABS: 50.0000 mg | ORAL_TABLET | Freq: Four times a day (QID) | ORAL | Status: DC | PRN

## 2023-08-08 MED ORDER — OXYCODONE HCL 5 MG PO TABS
5.0000 mg | ORAL_TABLET | ORAL | Status: DC | PRN
  Administered 2023-08-09: 10 mg via ORAL
  Filled 2023-08-08 (×2): qty 2

## 2023-08-08 SURGICAL SUPPLY — 34 items
APPLIER CLIP ROT 10 11.4 M/L (STAPLE) ×1 IMPLANT
BAG COUNTER SPONGE SURGICOUNT (BAG) IMPLANT
CABLE HIGH FREQUENCY MONO STRZ (ELECTRODE) ×1 IMPLANT
CHLORAPREP W/TINT 26 (MISCELLANEOUS) ×2 IMPLANT
CLIP APPLIE ROT 10 11.4 M/L (STAPLE) ×1 IMPLANT
COVER MAYO STAND XLG (MISCELLANEOUS) ×1 IMPLANT
COVER SURGICAL LIGHT HANDLE (MISCELLANEOUS) ×1 IMPLANT
DERMABOND ADVANCED .7 DNX12 (GAUZE/BANDAGES/DRESSINGS) IMPLANT
DRAPE C-ARM 42X120 X-RAY (DRAPES) ×1 IMPLANT
ELECT PENCIL ROCKER SW 15FT (MISCELLANEOUS) IMPLANT
ELECT REM PT RETURN 15FT ADLT (MISCELLANEOUS) ×1 IMPLANT
GAUZE SPONGE 2X2 8PLY STRL LF (GAUZE/BANDAGES/DRESSINGS) ×1 IMPLANT
GLOVE SURG ORTHO 8.0 STRL STRW (GLOVE) ×1 IMPLANT
GLOVE SURG SYN 7.5 E (GLOVE) ×2 IMPLANT
GLOVE SURG SYN 7.5 PF PI (GLOVE) ×2 IMPLANT
GOWN STRL REUS W/ TWL XL LVL3 (GOWN DISPOSABLE) ×1 IMPLANT
IRRIG SUCT STRYKERFLOW 2 WTIP (MISCELLANEOUS) ×1 IMPLANT
IRRIGATION SUCT STRKRFLW 2 WTP (MISCELLANEOUS) ×1 IMPLANT
KIT BASIN OR (CUSTOM PROCEDURE TRAY) ×1 IMPLANT
KIT TURNOVER KIT A (KITS) IMPLANT
MAT PREVALON FULL STRYKER (MISCELLANEOUS) IMPLANT
SCISSORS LAP 5X35 DISP (ENDOMECHANICALS) ×1 IMPLANT
SET CHOLANGIOGRAPH MIX (MISCELLANEOUS) ×1 IMPLANT
SET TUBE SMOKE EVAC HIGH FLOW (TUBING) ×1 IMPLANT
SLEEVE Z-THREAD 5X100MM (TROCAR) ×2 IMPLANT
SPIKE FLUID TRANSFER (MISCELLANEOUS) ×1 IMPLANT
SUT MNCRL AB 4-0 PS2 18 (SUTURE) ×1 IMPLANT
SYS BAG RETRIEVAL 10MM (BASKET) ×1 IMPLANT
SYSTEM BAG RETRIEVAL 10MM (BASKET) ×1 IMPLANT
TOWEL OR 17X26 10 PK STRL BLUE (TOWEL DISPOSABLE) ×1 IMPLANT
TRAY LAPAROSCOPIC (CUSTOM PROCEDURE TRAY) ×2 IMPLANT
TROCAR 11X100 Z THREAD (TROCAR) ×1 IMPLANT
TROCAR BALLN 12MMX100 BLUNT (TROCAR) ×1 IMPLANT
TROCAR Z-THREAD OPTICAL 5X100M (TROCAR) ×1 IMPLANT

## 2023-08-08 NOTE — H&P (View-Only) (Signed)
 Kathleen Montoya  1980-11-08 [id number]  CARE TEAM:  PCP: Teresa Channel, MD  Outpatient Care Team: Patient Care Team: Teresa Channel, MD as PCP - General (Family Medicine) Jeffrie Oneil BROCKS, MD as PCP - Cardiology (Cardiology) Jeffrie Oneil BROCKS, MD as Consulting Physician (Cardiology) Sebastian Moles, MD as Consulting Physician (General Surgery) Goforth, Helayne ORN, MD as Referring Physician (Neurology) Burnette Fallow, MD as Consulting Physician (Gastroenterology)  Inpatient Treatment Team: Treatment Team:  Raford Lenis, MD Netta Newell PARAS, EMT Con Leonette BIRCH, NT Bobbette Hercules, MD Ccs, Md, MD   This patient is a 43 y.o.female who presents today for surgical evaluation at the request of Dr Raford, [facility] ED.   Chief complaint / Reason for evaluation: Recurrent biliary colic, probable cholecystitis  43 year old female.  Morbidly obese with BMI 55.  History of palpitations and tachycardia and PVCs followed by Dr. Jeffrie with cardiology on metoprolol .  Discussion of some mild chronic heart failure.  History of at least childhood asthma with some concern for reactive airway disease.  Heartburn/GERD.  Had seen [facility] in the past.  Claims allergies to numerous medications although it looks like most of these are just a feeling that they do not work or intolerances.  Had an episode of abdominal pain and went to emergency department last year.  Gallbladder etiology suspected.  Followed up and consulted with Dr. Sebastian with our group a couple months ago.  He suspected biliary colic and recommended cholecystectomy.  Patient wanted to try diet modifications and reevaluate in a few months.  Also had upper abdominal epigastric hernia.  Patient had episode of abdominal pain biliary colic but has been persistent for the past few days.  Progressing to nausea and vomiting and not able to keep anything down.  Fever.  Comes to the ER tonight with persistent pain.  Liver functions  test slightly elevated.  While there is fever there is no leukocytosis but patient uncomfortable requiring narcotics repeatedly.  Surgical consultation requested for probable cholecystitis.  Looks like patient has tried to explore weight loss treatments/interventions but has had issues with insurance and funding to get on a regular plan.  Have been followed by [facility].  EGD a decade ago raise concern of some nonerosive gastroesophageal reflux disease.  Has been on PPI for over a decade.  Omeprazole right now.  Does not sound like heartburn reflux severe issues at this time.   Assessment  Kathleen Montoya  43 y.o. female       Problem List:  Principal Problem:   Acute on chronic cholecystitis Active Problems:   Morbid obesity with BMI of 50.0-59.9, adult (HCC)   Palpitations   Gastroesophageal reflux disease without esophagitis   Insomnia   Hypercholesteremia   Tachycardia   LFT elevation   Epigastric hernia   Multiple drug allergies   Chronic combined systolic and diastolic congestive heart failure (HCC)   Hepatic steatosis   Persistent biliary colic suspicious for cholecystitis  Plan:  Medical admission.  It would be helpful to make sure she does not have any acute cardiac or pulmonary issues.  She did come in tachycardic but seems to have stabilized.  Looks like Dr. Jeffrie ordered an echocardiogram for last year but that did not happen?  He had discussed congestive heart failure on his note fall 2024.  Does not seem severe.  Just double check to be safe given her morbid obesity and need for surgery  IV antibiotics.  Will do ceftriaxone  for  now.  Nausea and pain control.  IV fluid resuscitation.  Trend LFTs.  May just reflect hepatic steatosis.  Bilirubin only 1.8 = not severely high.  As long as bilirubin less than 4, will proceed with cholangiogram and laparoscopic cholecystectomy first   Family history of diabetes but no hyperglycemia at this  time.  Keep on PPI for now.  History of reactive airway disease but does not seem to be severely hypoxic.  Albuterol  as needed  If no medical concerns, most likely proceed with cholecystectomy.  Laparoscopic approach.  See if can do cholangiograms given the slightly elevated LFTs.  I have tentatively booked this for later this morning pending medical clearance.  This is the second time she is coming to the ER.  She seemed hesitant to proceed with surgery on the office consultation.  I do not think we should delay since she keeps failing outpatient management    I reviewed nursing notes, ED provider notes, last 24 h vitals and pain scores, last 48 h intake and output, last 24 h labs and trends, last 24 h imaging results, and GI, cardiology, primary care notes . I have reviewed this patient's available data, including medical history, events of note, test results, etc as part of my evaluation.  A significant portion of that time was spent in counseling.  Care during the described time interval was provided by me.  This care required moderate level of medical decision making.  08/08/2023  Elspeth KYM Schultze, MD, FACS, MASCRS Esophageal, Gastrointestinal & Colorectal Surgery Robotic and Minimally Invasive Surgery  [facility] [address] [place], [place] [address] [phone] Fax [phone] Main  CONTACT INFORMATION: Weekday (9AM-5PM): Call [facility] main office at [phone] Weeknight (5PM-9AM) or Weekend/Holiday: Check EPIC Web Links tab & use AMION (password  [email or web address]) for General Surgery [facility] coverage  Please, DO NOT use SecureChat  (it is not reliable communication to reach operating surgeons & will lead to a delay in care).   Epic staff messaging available for outptient concerns needing 1-2 business day response.      08/08/2023      Past Medical History:  Diagnosis Date   Abscess of buttock 08/08/2023    Childhood asthma    History of depression    In college   History of HPV infection    History of sciatica 08/08/2023   Hypercholesteremia    Insomnia    Low back pain    Migraines    Multiple drug allergies 08/08/2023   Ovarian cyst    Patent foramen ovale    Pneumonia 05/2005   Polycystic ovarian disease    Tachycardia    Vitamin D deficiency     Past Surgical History:  Procedure Laterality Date   TONSILLECTOMY     WISDOM TOOTH EXTRACTION      Social History   Socioeconomic History   Marital status: Single    Spouse name: Not on file   Number of children: Not on file   Years of education: Not on file   Highest education level: Not on file  Occupational History   Occupation: energy manager  Tobacco Use   Smoking status: Never   Smokeless tobacco: Never  Vaping Use   Vaping status: Never Used  Substance and Sexual Activity   Alcohol use: Yes    Comment: occ   Drug use: No   Sexual activity: Yes    Birth control/protection: I.U.D.  Other  Topics Concern   Not on file  Social History Narrative   Not on file   Social Drivers of Health   Financial Resource Strain: Low Risk  (02/02/2023)   Received from [facility]   Overall Financial Resource Strain (CARDIA)    Difficulty of Paying Living Expenses: Not very hard  Food Insecurity: Food Insecurity Present (02/02/2023)   Received from [facility]   Hunger Vital Sign    Worried About Running Out of Food in the Last Year: Sometimes true    Ran Out of Food in the Last Year: Never true  Transportation Needs: No Transportation Needs (02/02/2023)   Received from [facility] - Transportation    Lack of Transportation (Medical): No    Lack of Transportation (Non-Medical): No  Physical Activity: Insufficiently Active (02/02/2023)   Received from [facility]   Exercise Vital Sign    Days of Exercise per Week: 1 day    Minutes of Exercise per Session: 30 min  Stress: No Stress Concern Present  (02/02/2023)   Received from [facility] of [facility]ational [facility] - Occupational Stress Questionnaire    Feeling of Stress : Only a little  Social Connections: Somewhat Isolated (02/02/2023)   Received from [facility]   Social Network    How would you rate your social network (family, work, friends)?: Restricted participation with some degree of social isolation  Intimate Partner Violence: Not At Risk (02/02/2023)   Received from [facility]   HITS    Over the last 12 months how often did your partner physically hurt you?: Never    Over the last 12 months how often did your partner insult you or talk down to you?: Never    Over the last 12 months how often did your partner threaten you with physical harm?: Never    Over the last 12 months how often did your partner scream or curse at you?: Never    No family history on file.  Current Facility-Administered Medications  Medication Dose Route Frequency Provider Last Rate Last Admin   acetaminophen  (TYLENOL ) suppository 650 mg  650 mg Rectal Q6H PRN Sheldon Standing, MD       acetaminophen  (TYLENOL ) tablet 1,000 mg  1,000 mg Oral On Call to OR Sheldon Standing, MD       acetaminophen  (TYLENOL ) tablet 325-650 mg  325-650 mg Oral Q6H PRN Sheldon Standing, MD       alum & mag hydroxide-simeth (MAALOX/MYLANTA) 200-200-20 MG/5ML suspension 30 mL  30 mL Oral Q6H PRN Sheldon Standing, MD       bisacodyl  (DULCOLAX) suppository 10 mg  10 mg Rectal Q12H PRN Sheldon Standing, MD       bismuth  subsalicylate (PEPTO BISMOL) 262 MG/15ML suspension 30 mL  30 mL Oral Q8H PRN Sheldon Standing, MD       bupivacaine  liposome (EXPAREL ) 1.3 % injection 266 mg  20 mL Infiltration Once Sheldon Standing, MD       NOREEN ON 08/09/2023] cefTRIAXone  (ROCEPHIN ) 2 g in sodium chloride  0.9 % 100 mL IVPB  2 g Intravenous Q24H Sheldon Standing, MD       Chlorhexidine  Gluconate Cloth 2 % PADS 6 each  6 each Topical Once Sheldon Standing, MD       And   Chlorhexidine  Gluconate Cloth  2 % PADS 6 each  6 each Topical Once Sheldon Standing, MD       diphenhydrAMINE  (BENADRYL ) injection 12.5-25 mg  12.5-25 mg Intravenous  Q6H PRN Sheldon Standing, MD       [START ON 08/09/2023] feeding supplement (ENSURE PRE-SURGERY) liquid 296 mL  296 mL Oral Once Sheldon Standing, MD       HYDROmorphone  (DILAUDID ) injection 0.5-2 mg  0.5-2 mg Intravenous Q2H PRN Sheldon Standing, MD       lactated ringers  bolus 1,000 mL  1,000 mL Intravenous Once Sheldon Standing, MD       lactated ringers  bolus 1,000 mL  1,000 mL Intravenous Q8H PRN Sheldon Standing, MD       magic mouthwash  15 mL Oral QID PRN Sheldon Standing, MD       menthol -cetylpyridinium (CEPACOL) lozenge 3 mg  1 lozenge Oral PRN Sheldon Standing, MD       methocarbamol  (ROBAXIN ) injection 1,000 mg  1,000 mg Intravenous Q6H PRN Sheldon Standing, MD       metoprolol  tartrate (LOPRESSOR ) injection 5 mg  5 mg Intravenous Q6H PRN Sheldon Standing, MD       metroNIDAZOLE  (FLAGYL ) IVPB 500 mg  500 mg Intravenous Q12H Kayleah Appleyard, MD       naphazoline-glycerin  (CLEAR EYES REDNESS) ophth solution 1-2 drop  1-2 drop Both Eyes QID PRN Sheldon Standing, MD       ondansetron  (ZOFRAN ) injection 4 mg  4 mg Intravenous Q6H PRN Sheldon Standing, MD       oxyCODONE  (Oxy IR/ROXICODONE ) immediate release tablet 5-10 mg  5-10 mg Oral Q4H PRN Sheldon Standing, MD       phenol (CHLORASEPTIC) mouth spray 2 spray  2 spray Mouth/Throat PRN Sheldon Standing, MD       polycarbophil (FIBERCON) tablet 625 mg  625 mg Oral BID Sheldon Standing, MD       prochlorperazine  (COMPAZINE ) injection 5-10 mg  5-10 mg Intravenous Q4H PRN Sheldon Standing, MD       sodium chloride  (OCEAN) 0.65 % nasal spray 1-2 spray  1-2 spray Each Nare Q6H PRN Sheldon Standing, MD       Current Outpatient Medications  Medication Sig Dispense Refill   albuterol  (VENTOLIN  HFA) 108 (90 Base) MCG/ACT inhaler INHALE 1 PUFF BY MOUTH AS DIRECTED (Patient not taking: Reported on 04/01/2023) 8.5 g 3   amoxicillin -clavulanate (AUGMENTIN ) 875-125 MG  tablet Take 1 tablet by mouth every 12 (twelve) hours. (Patient not taking: Reported on 04/01/2023) 14 tablet 0   Cholecalciferol (VITAMIN D) 125 MCG (5000 UT) CAPS Take 1 capsule by mouth daily.     diclofenac  Sodium (VOLTAREN ) 1 % GEL Apply 4 g topically 4 (four) times daily. (Patient not taking: Reported on 12/25/2021) 100 g 1   etonogestrel-ethinyl estradiol (NUVARING) 0.12-0.015 MG/24HR vaginal ring Place 1 each vaginally every 28 (twenty-eight) days. 1 ring leave in place for 3 weeks, remove, and replace with a new ring after 7 day break (Patient not taking: Reported on 12/25/2021)     HYDROcodone -acetaminophen  (NORCO/VICODIN) 5-325 MG tablet Take 1 tablet by mouth every 6 (six) hours as needed for pain. (Patient not taking: Reported on 12/25/2021)     methocarbamol  (ROBAXIN ) 750 MG tablet Take 750 mg by mouth 3 (three) times daily. (Patient not taking: Reported on 12/25/2021)     metoprolol  succinate (TOPROL -XL) 50 MG 24 hr tablet TAKE WITH OR IMMEDIATELY FOLLOWING A MEAL 90 tablet 3   Multiple Vitamin (MULTIVITAMIN) capsule Take 1 capsule by mouth daily.     naproxen  (NAPROSYN ) 375 MG tablet Take 1 tablet (375 mg total) by mouth 2 (two) times daily. 20 tablet 0   naratriptan (AMERGE)  2.5 MG tablet Take 2.5 mg by mouth as needed for migraine. (Patient not taking: Reported on 04/01/2023)  0   omeprazole (PRILOSEC OTC) 20 MG tablet Take 20 mg by mouth every other day.     ondansetron  (ZOFRAN -ODT) 8 MG disintegrating tablet Take 1 tablet (8 mg total) by mouth every 8 (eight) hours as needed for nausea or vomiting. 12 tablet 0   oxyCODONE -acetaminophen  (PERCOCET/ROXICET) 5-325 MG tablet Take 1 tablet by mouth every 6 (six) hours as needed for severe pain (pain score 7-10). 12 tablet 0   predniSONE (DELTASONE) 20 MG tablet Take 20-60 mg by mouth every other day. Take 3 tablets on day 1-3, take 2 tablets on day 4-6, take 1 tablet on day 7-9. (Patient not taking: Reported on 12/25/2021)     promethazine  (PHENERGAN) 25 MG tablet Take 25 mg by mouth every 6 (six) hours as needed for nausea.     rizatriptan (MAXALT) 10 MG tablet Take 10 mg by mouth as needed for migraine.     spironolactone  (ALDACTONE ) 25 MG tablet Take 1 tablet (25 mg total) by mouth daily. 90 tablet 3   Ubrogepant (UBRELVY) 100 MG TABS Take 100 mg by mouth as needed (migraine).       Allergies  Allergen Reactions   Clindamycin Hives   Topiramate Other (See Comments)    word finding difficulty   Amitriptyline Other (See Comments)    Not effective: Lack of Therapeutic effect   Clindamycin/Lincomycin Rash   Erythromycin Other (See Comments)    Abdominal pain   Loestrin [Norethindrone Acet-Ethinyl Est] Hives   Maxalt [Rizatriptan Benzoate] Other (See Comments)    Not effective   Norethindrone-Eth Estradiol Hives   Nortriptyline Hcl Other (See Comments)    Not effective: Lack Therapeutic Effect   Sumatriptan Other (See Comments)    Felt like acid was in her brain   Zomig [Zolmitriptan] Other (See Comments)    Not effective      BP 110/64   Pulse 97   Temp 99.2 F (37.3 C) (Oral)   Resp 20   SpO2 96%     Results:   Labs: Results for orders placed or performed during the hospital encounter of 08/07/23 (from the past 48 hours)  Comprehensive metabolic panel     Status: Abnormal   Collection Time: 08/07/23  9:56 PM  Result Value Ref Range   Sodium 133 (L) 135 - 145 mmol/L   Potassium 4.5 3.5 - 5.1 mmol/L    Comment: HEMOLYSIS AT THIS LEVEL MAY AFFECT RESULT   Chloride 102 98 - 111 mmol/L   CO2 19 (L) 22 - 32 mmol/L   Glucose, Bld 106 (H) 70 - 99 mg/dL    Comment: Glucose reference range applies only to samples taken after fasting for at least 8 hours.   BUN 18 6 - 20 mg/dL   Creatinine, Ser 9.12 0.44 - 1.00 mg/dL   Calcium  8.7 (L) 8.9 - 10.3 mg/dL   Total Protein 8.4 (H) 6.5 - 8.1 g/dL   Albumin 4.1 3.5 - 5.0 g/dL   AST 26 15 - 41 U/L    Comment: HEMOLYSIS AT THIS LEVEL MAY AFFECT RESULT   ALT 20 0 -  44 U/L    Comment: HEMOLYSIS AT THIS LEVEL MAY AFFECT RESULT   Alkaline Phosphatase 63 38 - 126 U/L   Total Bilirubin 1.8 (H) 0.0 - 1.2 mg/dL    Comment: HEMOLYSIS AT THIS LEVEL MAY AFFECT RESULT   GFR, Estimated >  60 >60 mL/min    Comment: (NOTE) Calculated using the CKD-EPI Creatinine Equation (2021)    Anion gap 12 5 - 15    Comment: Performed at [facility], [address] [place], [place] [address]  Lipase, blood     Status: None   Collection Time: 08/07/23  9:56 PM  Result Value Ref Range   Lipase 28 11 - 51 U/L    Comment: Performed at [facility], [address], [place], [place] [address]  CBC with Diff     Status: Abnormal   Collection Time: 08/07/23  9:56 PM  Result Value Ref Range   WBC 9.0 4.0 - 10.5 K/uL   RBC 4.82 3.87 - 5.11 MIL/uL   Hemoglobin 13.9 12.0 - 15.0 g/dL   HCT 57.6 63.9 - 53.9 %   MCV 87.8 80.0 - 100.0 fL   MCH 28.8 26.0 - 34.0 pg   MCHC 32.9 30.0 - 36.0 g/dL   RDW 86.2 88.4 - 84.4 %   Platelets 308 150 - 400 K/uL   nRBC 0.0 0.0 - 0.2 %   Neutrophils Relative % 89 %   Neutro Abs 7.9 (H) 1.7 - 7.7 K/uL   Lymphocytes Relative 7 %   Lymphs Abs 0.6 (L) 0.7 - 4.0 K/uL   Monocytes Relative 4 %   Monocytes Absolute 0.4 0.1 - 1.0 K/uL   Eosinophils Relative 0 %   Eosinophils Absolute 0.0 0.0 - 0.5 K/uL   Basophils Relative 0 %   Basophils Absolute 0.0 0.0 - 0.1 K/uL   Immature Granulocytes 0 %   Abs Immature Granulocytes 0.02 0.00 - 0.07 K/uL    Comment: Performed at [facility], [address] [place], [place] [address]  Urinalysis, Routine w reflex microscopic -Urine, Clean Catch     Status: Abnormal   Collection Time: 08/07/23  9:56 PM  Result Value Ref Range   Color, Urine YELLOW YELLOW   APPearance HAZY (A) CLEAR   Specific Gravity, Urine 1.028 1.005 - 1.030   pH 5.0 5.0 - 8.0   Glucose, UA NEGATIVE NEGATIVE mg/dL   Hgb urine dipstick NEGATIVE NEGATIVE   Bilirubin Urine NEGATIVE  NEGATIVE   Ketones, ur NEGATIVE NEGATIVE mg/dL   Protein, ur NEGATIVE NEGATIVE mg/dL   Nitrite NEGATIVE NEGATIVE   Leukocytes,Ua NEGATIVE NEGATIVE    Comment: Performed at [facility], [address] [place], [place] [address]  hCG, serum, qualitative     Status: None   Collection Time: 08/07/23  9:56 PM  Result Value Ref Range   Preg, Serum NEGATIVE NEGATIVE    Comment:        THE SENSITIVITY OF THIS METHODOLOGY IS >10 mIU/mL. Performed at [facility], [address], [place], [place] [address]     Imaging / Studies: US  Abdomen Limited RUQ (LIVER/GB) Result Date: 08/08/2023 CLINICAL DATA:  Right upper quadrant pain. EXAM: ULTRASOUND ABDOMEN LIMITED RIGHT UPPER QUADRANT COMPARISON:  None Available. FINDINGS: Gallbladder: A 3.3 cm shadowing echogenic gallstone is seen within the gallbladder lumen. The gallbladder wall is limited in evaluation. A positive sonographic Beverley sign is noted by sonographer. Common bile duct: Diameter: 3.0 mm Liver: No focal lesion identified. Diffusely increased echogenicity of the liver parenchyma is noted. Portal vein is patent on color Doppler imaging with normal direction of blood flow towards the liver. Other: None. IMPRESSION: 1. Cholelithiasis, in the setting of a positive sonographic Murphy sign which is suggestive of acute  cholecystitis. 2. Hepatic steatosis. Electronically Signed   By: Suzen Dials M.D.   On: 08/08/2023 01:20    Medications / Allergies: per chart  Antibiotics: Anti-infectives (From admission, onward)    Start     Dose/Rate Route Frequency Ordered Stop   08/09/23 0200  cefTRIAXone  (ROCEPHIN ) 2 g in sodium chloride  0.9 % 100 mL IVPB        2 g 200 mL/hr over 30 Minutes Intravenous Every 24 hours 08/08/23 0241     08/08/23 0300  metroNIDAZOLE  (FLAGYL ) IVPB 500 mg        500 mg 100 mL/hr over 60 Minutes Intravenous Every 12 hours 08/08/23 0244     08/08/23 0200  cefTRIAXone  (ROCEPHIN ) 2 g  in sodium chloride  0.9 % 100 mL IVPB        2 g 200 mL/hr over 30 Minutes Intravenous  Once 08/08/23 0149 08/08/23 0246   08/08/23 0200  metroNIDAZOLE  (FLAGYL ) IVPB 500 mg  Status:  Discontinued        500 mg 100 mL/hr over 60 Minutes Intravenous  Once 08/08/23 0149 08/08/23 0244         Note: Portions of this report may have been transcribed using voice recognition software. Every effort was made to ensure accuracy; however, inadvertent computerized transcription errors may be present.   Any transcriptional errors that result from this process are unintentional.    Elspeth KYM Schultze, MD, FACS, MASCRS Esophageal, Gastrointestinal & Colorectal Surgery Robotic and Minimally Invasive Surgery  [facility] [address] [place], [place] [address] [phone] Fax [phone] Main  CONTACT INFORMATION: Weekday (9AM-5PM): Call [facility] main office at [phone] Weeknight (5PM-9AM) or Weekend/Holiday: Check EPIC Web Links tab & use AMION (password  [email or web address]) for General Surgery [facility] coverage  Please, DO NOT use SecureChat  (it is not reliable communication to reach operating surgeons & will lead to a delay in care).   Epic staff messaging available for outptient concerns needing 1-2 business day response.       08/08/2023  2:59 AM

## 2023-08-08 NOTE — Consult Note (Signed)
 Consult Note  Kathleen Montoya 1980/12/13  [id number].    Requesting MD: [name] Chief Complaint/Reason for Consult: abdominal pain, cholelithiasis  HPI:  43 y.o. female with medical history significant for hypercholesteremia, PFO, polycystic ovarian disease, GERD, tachycardia with PVCs followed by cardiology - Dr. Jeffrie who presented to [facility] ED with abdominal pain. Pain began overnight 2/5 waking her from sleep. She has had associated nausea and vomiting. She has had a fever - 101.96F. Pain and nausea have persisted since onset but improved with medications. No recent emesis. She has not eaten since before pain started. She has had some associated diarrhea as well.  She has history of similar prior episode of pain for which she was seen in the ED. Gallbladder etiology suspected and she followed up with Dr. Sebastian outpatient who recommended cholecystectomy. Surgery was deferred with plans for diet modification and reevaluation.   She sees cardiology regularly. She has not had any recent [facility] or acute worsening of edema.  Substance use: occ etoh use Blood thinners: none Past Surgeries: no prior abdominal surgeries Employment: works as merchandiser, retail at [employer], occupational therapist   ROS: ROS reviewed and as above  No family history on file.  Past Medical History:  Diagnosis Date   Abscess of buttock 08/08/2023   Childhood asthma    History of depression    In college   History of HPV infection    History of sciatica 08/08/2023   Hypercholesteremia    Insomnia    Low back pain    Migraines    Multiple drug allergies 08/08/2023   Ovarian cyst    Patent foramen ovale    Pneumonia 05/2005   Polycystic ovarian disease    Tachycardia    Vitamin D deficiency     Past Surgical History:  Procedure Laterality Date   TONSILLECTOMY     WISDOM TOOTH EXTRACTION      Social History:  reports that she has never smoked. She has never used smokeless tobacco. She reports  current alcohol use. She reports that she does not use drugs.  Allergies:  Allergies  Allergen Reactions   Clindamycin Rash   Topiramate Other (See Comments)    word finding difficulty   Amitriptyline Other (See Comments)    Not effective: Lack of Therapeutic effect   Clindamycin/Lincomycin Rash   Erythromycin Other (See Comments)    Abdominal pain   Loestrin [Norethindrone Acet-Ethinyl Est] Hives   Maxalt [Rizatriptan Benzoate] Other (See Comments)    Not effective   Nortriptyline Hcl Other (See Comments)    Not effective: Lack Therapeutic Effect   Sumatriptan Other (See Comments)    Felt like acid was in her brain, if given IM   Zomig [Zolmitriptan] Other (See Comments)    Not effective     (Not in a hospital admission)   Blood pressure 121/81, pulse 76, temperature 98.7 F (37.1 C), temperature source Oral, resp. rate 20, SpO2 97%. Physical Exam: General: pleasant, WD, female who is laying in bed in NAD HEENT: head is normocephalic, atraumatic.  Sclera are noninjected.  Pupils equal and round. EOMs intact.  Ears and nose without any masses or lesions.  Mouth is pink and moist Heart:  Palpable radial and pedal pulses bilaterally Lungs:  Respiratory effort nonlabored on room air Abd: soft, ND, no masses, hernias, or organomegaly. Mild to moderate TTP in epigastrium and RUQ. +murphy's sign MSK: all 4 extremities are symmetrical with no cyanosis, clubbing, or edema. Skin: warm  and dry with no masses, lesions, or rashes Neuro: Cranial nerves 2-12 grossly intact, sensation is normal throughout Psych: A&Ox3 with an appropriate affect.    Results for orders placed or performed during the hospital encounter of 08/07/23 (from the past 48 hours)  Comprehensive metabolic panel     Status: Abnormal   Collection Time: 08/07/23  9:56 PM  Result Value Ref Range   Sodium 133 (L) 135 - 145 mmol/L   Potassium 4.5 3.5 - 5.1 mmol/L    Comment: HEMOLYSIS AT THIS LEVEL MAY AFFECT RESULT    Chloride 102 98 - 111 mmol/L   CO2 19 (L) 22 - 32 mmol/L   Glucose, Bld 106 (H) 70 - 99 mg/dL    Comment: Glucose reference range applies only to samples taken after fasting for at least 8 hours.   BUN 18 6 - 20 mg/dL   Creatinine, Ser 9.12 0.44 - 1.00 mg/dL   Calcium  8.7 (L) 8.9 - 10.3 mg/dL   Total Protein 8.4 (H) 6.5 - 8.1 g/dL   Albumin 4.1 3.5 - 5.0 g/dL   AST 26 15 - 41 U/L    Comment: HEMOLYSIS AT THIS LEVEL MAY AFFECT RESULT   ALT 20 0 - 44 U/L    Comment: HEMOLYSIS AT THIS LEVEL MAY AFFECT RESULT   Alkaline Phosphatase 63 38 - 126 U/L   Total Bilirubin 1.8 (H) 0.0 - 1.2 mg/dL    Comment: HEMOLYSIS AT THIS LEVEL MAY AFFECT RESULT   GFR, Estimated >60 >60 mL/min    Comment: (NOTE) Calculated using the CKD-EPI Creatinine Equation (2021)    Anion gap 12 5 - 15    Comment: Performed at [facility], [address] [place], [place] [address]  Lipase, blood     Status: None   Collection Time: 08/07/23  9:56 PM  Result Value Ref Range   Lipase 28 11 - 51 U/L    Comment: Performed at [facility], [address], [place], [place] [address]  CBC with Diff     Status: Abnormal   Collection Time: 08/07/23  9:56 PM  Result Value Ref Range   WBC 9.0 4.0 - 10.5 K/uL   RBC 4.82 3.87 - 5.11 MIL/uL   Hemoglobin 13.9 12.0 - 15.0 g/dL   HCT 57.6 63.9 - 53.9 %   MCV 87.8 80.0 - 100.0 fL   MCH 28.8 26.0 - 34.0 pg   MCHC 32.9 30.0 - 36.0 g/dL   RDW 86.2 88.4 - 84.4 %   Platelets 308 150 - 400 K/uL   nRBC 0.0 0.0 - 0.2 %   Neutrophils Relative % 89 %   Neutro Abs 7.9 (H) 1.7 - 7.7 K/uL   Lymphocytes Relative 7 %   Lymphs Abs 0.6 (L) 0.7 - 4.0 K/uL   Monocytes Relative 4 %   Monocytes Absolute 0.4 0.1 - 1.0 K/uL   Eosinophils Relative 0 %   Eosinophils Absolute 0.0 0.0 - 0.5 K/uL   Basophils Relative 0 %   Basophils Absolute 0.0 0.0 - 0.1 K/uL   Immature Granulocytes 0 %   Abs Immature Granulocytes 0.02 0.00 - 0.07 K/uL    Comment: Performed  at [facility], [address], [place], [place] [address]  Urinalysis, Routine w reflex microscopic -Urine, Clean Catch     Status: Abnormal   Collection Time: 08/07/23  9:56 PM  Result Value Ref Range   Color, Urine YELLOW YELLOW   APPearance HAZY (A)  CLEAR   Specific Gravity, Urine 1.028 1.005 - 1.030   pH 5.0 5.0 - 8.0   Glucose, UA NEGATIVE NEGATIVE mg/dL   Hgb urine dipstick NEGATIVE NEGATIVE   Bilirubin Urine NEGATIVE NEGATIVE   Ketones, ur NEGATIVE NEGATIVE mg/dL   Protein, ur NEGATIVE NEGATIVE mg/dL   Nitrite NEGATIVE NEGATIVE   Leukocytes,Ua NEGATIVE NEGATIVE    Comment: Performed at [facility], [address] [place], [place] [address]  hCG, serum, qualitative     Status: None   Collection Time: 08/07/23  9:56 PM  Result Value Ref Range   Preg, Serum NEGATIVE NEGATIVE    Comment:        THE SENSITIVITY OF THIS METHODOLOGY IS >10 mIU/mL. Performed at [facility], [address], [place], [place] [address]    US  Abdomen Limited RUQ (LIVER/GB) Result Date: 08/08/2023 CLINICAL DATA:  Right upper quadrant pain. EXAM: ULTRASOUND ABDOMEN LIMITED RIGHT UPPER QUADRANT COMPARISON:  None Available. FINDINGS: Gallbladder: A 3.3 cm shadowing echogenic gallstone is seen within the gallbladder lumen. The gallbladder wall is limited in evaluation. A positive sonographic Beverley sign is noted by sonographer. Common bile duct: Diameter: 3.0 mm Liver: No focal lesion identified. Diffusely increased echogenicity of the liver parenchyma is noted. Portal vein is patent on color Doppler imaging with normal direction of blood flow towards the liver. Other: None. IMPRESSION: 1. Cholelithiasis, in the setting of a positive sonographic Murphy sign which is suggestive of acute cholecystitis. 2. Hepatic steatosis. Electronically Signed   By: Suzen Dials M.D.   On: 08/08/2023 01:20      Assessment/Plan Symptomatic cholelithiasis, likely  acute cholecystitis Hyperbilirubinemia   Patient seen and examined and relevant labs and imaging personally reviewed. Work up and history concerning for acute cholecystitis. Total bilirubin on admission was elevated to 1.8 with repeat pending. From our standpoint can await repeat prior to MRCP. Agree with IV abx. Ultimately recommend laparoscopic cholecystectomy for definitive management. She has cardiac history (PFO, CHF) and cardiology has seen with assessment of overall low surgical risk. ECHO is pending. Pending ECHO results and OR availability plan for lap chole +/- IOC either today or tomorrow. If no surgery today she can have clear liquids and NPO MN. Will follow T bili.  FEN: NPO ID: [email or web address]  VTE: okay for chemical prophylaxis from surgical standpoint  Per primary: OHS/OSA Palpitations/PFO/chronic HF - cardiology consulted PCOS  I reviewed ED provider notes, Consultant cardiology notes, hospitalist notes, last 24 h vitals and pain scores, last 48 h intake and output, last 24 h labs and trends, and last 24 h imaging results.   [place] VEAR Mais, [facility] 08/08/2023, 7:23 AM Please see Amion for pager number during day hours 7:00am-4:30pm

## 2023-08-08 NOTE — H&P (Signed)
 PCP:   Teresa Channel, MD   Chief Complaint:  Nausea and vomiting  HPI: This is a 43 y/o female w/ PMHx of extreme morbid obesity, PFO and mild heart failure, migraines. She see's Dr Jeffrie with cardiology. PCOS. She presents w/ c/o nausea and discomfort upper abdomen last night around 2am. By 4am she developed significant nausea and vomiting.  Her pain, nausea and vomiting was progressively worse. She has a known h/o gallstones that was being watched as she had been asymptomatic. She was taking prescribed zofran . She went on to develop a fever Tmax 101. She decided to come to the ER.   In the ER patient hemodynamically stable, afebrile.  T. bili 1.8.  WBCs 9.  Ultrasound right upper quadrant shows cholelithiasis with positive Murphy sign.  Surgeon consulted.  Review of Systems:  Per HPI  Past Medical History: Past Medical History:  Diagnosis Date   Abscess of buttock 08/08/2023   Childhood asthma    History of depression    In college   History of HPV infection    History of sciatica 08/08/2023   Hypercholesteremia    Insomnia    Low back pain    Migraines    Multiple drug allergies 08/08/2023   Ovarian cyst    Patent foramen ovale    Pneumonia 05/2005   Polycystic ovarian disease    Tachycardia    Vitamin D deficiency    Past Surgical History:  Procedure Laterality Date   TONSILLECTOMY     WISDOM TOOTH EXTRACTION      Medications: Prior to Admission medications   Medication Sig Start Date End Date Taking? Authorizing Provider  Cholecalciferol (VITAMIN D) 125 MCG (5000 UT) CAPS Take 2 capsules by mouth 2 (two) times a week.   Yes [provider]  metoprolol  succinate (TOPROL -XL) 50 MG 24 hr tablet TAKE WITH OR IMMEDIATELY FOLLOWING A MEAL Patient taking differently: Take 50 mg by mouth daily. Take with or immediately following a meal. 06/12/23  Yes Jeffrie Oneil BROCKS, MD  Multiple Vitamin (MULTIVITAMIN) capsule Take 1 capsule by mouth daily.   Yes [provider]  omeprazole (PRILOSEC OTC) 20 MG tablet Take 20 mg by mouth every other day.   Yes [provider]  ondansetron  (ZOFRAN -ODT) 8 MG disintegrating tablet Take 1 tablet (8 mg total) by mouth every 8 (eight) hours as needed for nausea or vomiting. 06/06/23  Yes Randol Simmonds, MD  oxyCODONE -acetaminophen  (PERCOCET/ROXICET) 5-325 MG tablet Take 1 tablet by mouth every 6 (six) hours as needed for severe pain (pain score 7-10). 06/06/23  Yes Randol Simmonds, MD  Ubrogepant (UBRELVY) 100 MG TABS Take 100 mg by mouth as needed (migraine).   Yes [provider]  albuterol  (VENTOLIN  HFA) 108 (90 Base) MCG/ACT inhaler INHALE 1 PUFF BY MOUTH AS DIRECTED Patient not taking: Reported on 08/08/2023 02/04/20   Jeffrie Oneil BROCKS, MD    Allergies:   Allergies  Allergen Reactions   Clindamycin Rash   Topiramate Other (See Comments)    word finding difficulty   Amitriptyline Other (See Comments)    Not effective: Lack of Therapeutic effect   Clindamycin/Lincomycin Rash   Erythromycin Other (See Comments)    Abdominal pain   Loestrin [Norethindrone Acet-Ethinyl Est] Hives   Maxalt [Rizatriptan Benzoate] Other (See Comments)    Not effective   Nortriptyline Hcl Other (See Comments)    Not effective: Lack Therapeutic Effect   Sumatriptan Other (See Comments)    Felt like acid was in  her brain, if given IM   Zomig [Zolmitriptan] Other (See Comments)    Not effective     Social History:  reports that she has never smoked. She has never used smokeless tobacco. She reports current alcohol use. She reports that she does not use drugs.  Family History: No family history on file.  Physical Exam: Vitals:   08/07/23 2152 08/08/23 0115 08/08/23 0421 08/08/23 0422  BP: 92/66 110/64 (P) 121/81 121/81  Pulse: (!) 123 97 (P) 76 76  Resp: (!) 23 20  20   Temp: (!) 100.6 F (38.1 C) 99.2 F (37.3 C) (P) 98.7 F (37.1 C) 98.7 F (37.1 C)  TempSrc: Oral Oral (P) Oral Oral  SpO2: 95% 96%  97%     General:  A&Ox3, extreme morbidly obese, no acute distress Eyes: PERRLA, pink conjunctiva, no scleral icterus ENT: Moist oral mucosa, neck supple, no thyromegaly Lungs: CTA B/L, no wheeze, no crackles, no use of accessory muscles Cardiovascular: RRR, no regurgitation, no gallops, no murmurs. No carotid bruits, no JVD Abdomen: soft, positive BS, positive, mild TTP in the epigastric region, not an acute abdomen GU: not examined Neuro: CN II - XII grossly intact, sensation intact Musculoskeletal: strength 5/5 all extremities, no edema Skin: no rash, no subcutaneous crepitation, no decubitus Psych: appropriate patient   Labs on Admission:  Recent Labs    08/07/23 2156  NA 133*  K 4.5  CL 102  CO2 19*  GLUCOSE 106*  BUN 18  CREATININE 0.87  CALCIUM  8.7*   Recent Labs    08/07/23 2156  AST 26  ALT 20  ALKPHOS 63  BILITOT 1.8*  PROT 8.4*  ALBUMIN 4.1   Recent Labs    08/07/23 2156  LIPASE 28   Recent Labs    08/07/23 2156  WBC 9.0  NEUTROABS 7.9*  HGB 13.9  HCT 42.3  MCV 87.8  PLT 308     Radiological Exams on Admission: US  Abdomen Limited RUQ (LIVER/GB) Result Date: 08/08/2023 CLINICAL DATA:  Right upper quadrant pain. EXAM: ULTRASOUND ABDOMEN LIMITED RIGHT UPPER QUADRANT COMPARISON:  None Available. FINDINGS: Gallbladder: A 3.3 cm shadowing echogenic gallstone is seen within the gallbladder lumen. The gallbladder wall is limited in evaluation. A positive sonographic Beverley sign is noted by sonographer. Common bile duct: Diameter: 3.0 mm Liver: No focal lesion identified. Diffusely increased echogenicity of the liver parenchyma is noted. Portal vein is patent on color Doppler imaging with normal direction of blood flow towards the liver. Other: None. IMPRESSION: 1. Cholelithiasis, in the setting of a positive sonographic Murphy sign which is suggestive of acute cholecystitis. 2. Hepatic steatosis. Electronically Signed   By: Suzen Dials M.D.   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

## 2023-08-08 NOTE — Interval H&P Note (Signed)
 History and Physical Interval Note:  08/08/2023 2:45 PM  Kathleen Montoya  has presented today for surgery, with the diagnosis of Acute Cholecystitis.  The various methods of treatment have been discussed with the patient and family. After consideration of risks, benefits and other options for treatment, the patient has consented to    Procedure(s): LAPAROSCOPIC CHOLECYSTECTOMY WITH INTRAOPERATIVE CHOLANGIOGRAM (Right) as a surgical intervention.    The patient's history has been reviewed, patient examined, no change in status, stable for surgery.  I have reviewed the patient's chart and labs.  Questions were answered to the patient's satisfaction.    Krystal Spinner, MD [facility] A [facility] practice Office: [phone]   Krystal Spinner

## 2023-08-08 NOTE — Consult Note (Addendum)
 Cardiology Consultation   Patient ID: AZA DANTES MRN: [id number]; DOB: June 05, 1981  Admit date: 08/07/2023 Date of Consult: 08/08/2023  PCP:  Kathleen Channel, MD   [facility] Providers Cardiologist:  Kathleen Parchment, MD   Patient Profile:   Kathleen Montoya is a 43 y.o. female with a hx of palpitations, PVCs, PFO, HFmrEF, and morbid obesity, PCOS who is being seen 08/08/2023 for the evaluation of preoperative risk evaluation at the request of Kathleen Montoya.  History of Present Illness:   Kathleen Montoya she establish care with cardiology after an echocardiogram in 2011 demonstrated an LVEF 45-50%.  Follow-up heart monitor showed periodic evidence of sinus tachycardia.  Bubble study was early positive with several bubbles crossing within 1-2 beats suggesting PFO.  TEE performed 08/2009 showed PFO with trivial right-to-left shunt and no evidence of a large ASD.  Heart palpitations felt related to PVCs which has been treated with metoprolol .  Her last echocardiogram 07/2016 showed an LVEF 50-55%, and mildly LAE.  No obvious PFO by 2D color-flow but no bubble study was performed.  She was seen in the ER/20 07/2020 with palpitations and chest pressure.  EKG and troponin, TSH were unremarkable.  She has a history of nonspecific T wave changes in V2/V3.  She was last seen by Dr. Parchment 04/2023 and was doing well on beta-blocker.  He plan to recheck an echocardiogram for changes in cardiac function.  Unfortunately she has struggled with nausea and abdominal pain felt related to cholecystitis.  She presented to [facility] ED 08/07/2023 with abdominal pain, nausea, and vomiting that was progressively worsening.  She has a known history of gallstones.  She became febrile at 101.  RUQ US  shows cholelithiasis and positive Murphy sign.  General surgery was consulted for further management.  Cardiology requested for preoperative risk evaluation for Mace.  During my interview, she reports continued activity  without exertional angina. She never started the spironolactone  that was recommended at her last follow up appt due to side effects she read in the medication insert.  She denies signs and symptoms of congestive heart failure.  Palpitations are controlled with metoprolol  succinate.  She is able to complete 4.0 METS.   Past Medical History:  Diagnosis Date   Abscess of buttock 08/08/2023   Childhood asthma    History of depression    In college   History of HPV infection    History of sciatica 08/08/2023   Hypercholesteremia    Insomnia    Low back pain    Migraines    Multiple drug allergies 08/08/2023   Ovarian cyst    Patent foramen ovale    Pneumonia 05/2005   Polycystic ovarian disease    Tachycardia    Vitamin D deficiency     Past Surgical History:  Procedure Laterality Date   TONSILLECTOMY     WISDOM TOOTH EXTRACTION       Home Medications:  Prior to Admission medications   Medication Sig Start Date End Date Taking? Authorizing Provider  Cholecalciferol (VITAMIN D) 125 MCG (5000 UT) CAPS Take 2 capsules by mouth 2 (two) times a week.   Yes [provider]  metoprolol  succinate (TOPROL -XL) 50 MG 24 hr tablet TAKE WITH OR IMMEDIATELY FOLLOWING A MEAL Patient taking differently: Take 50 mg by mouth daily. Take with or immediately following a meal. 06/12/23  Yes Montoya Kathleen BROCKS, MD  Multiple Vitamin (MULTIVITAMIN) capsule Take 1 capsule by mouth daily.   Yes [provider]  omeprazole (PRILOSEC OTC) 20 MG tablet Take 20 mg by mouth every other day.   Yes [provider]  ondansetron  (ZOFRAN -ODT) 8 MG disintegrating tablet Take 1 tablet (8 mg total) by mouth every 8 (eight) hours as needed for nausea or vomiting. 06/06/23  Yes Kathleen Simmonds, MD  oxyCODONE -acetaminophen  (PERCOCET/ROXICET) 5-325 MG tablet Take 1 tablet by mouth every 6 (six) hours as needed for severe pain (pain score 7-10). 06/06/23  Yes Kathleen Simmonds, MD  Ubrogepant (UBRELVY) 100 MG TABS  Take 100 mg by mouth as needed (migraine).   Yes [provider]  albuterol  (VENTOLIN  HFA) 108 (90 Base) MCG/ACT inhaler INHALE 1 PUFF BY MOUTH AS DIRECTED Patient not taking: Reported on 08/08/2023 02/04/20   Kathleen Kathleen BROCKS, MD    Inpatient Medications: Scheduled Meds:  bupivacaine  liposome  20 mL Infiltration Once   Chlorhexidine  Gluconate Cloth  6 each Topical Once   And   Chlorhexidine  Gluconate Cloth  6 each Topical Once   enoxaparin  (LOVENOX ) injection  40 mg Subcutaneous Q24H   [START ON 08/09/2023] feeding supplement  296 mL Oral Once   pantoprazole   40 mg Oral BID AC   polycarbophil  625 mg Oral BID   spironolactone   25 mg Oral Daily   Continuous Infusions:  [START ON 08/09/2023] cefTRIAXone  (ROCEPHIN )  IV     lactated ringers      metronidazole  Stopped (08/08/23 0535)   PRN Meds: acetaminophen , acetaminophen , alum & mag hydroxide-simeth, bisacodyl , bismuth  subsalicylate, diphenhydrAMINE , HYDROmorphone  (DILAUDID ) injection, lactated ringers , magic mouthwash, menthol -cetylpyridinium, methocarbamol  (ROBAXIN ) injection, metoprolol  tartrate, naphazoline-glycerin , ondansetron  (ZOFRAN ) IV, oxyCODONE , phenol, prochlorperazine , sodium chloride   Allergies:    Allergies  Allergen Reactions   Clindamycin Rash   Topiramate Other (See Comments)    word finding difficulty   Amitriptyline Other (See Comments)    Not effective: Lack of Therapeutic effect   Clindamycin/Lincomycin Rash   Erythromycin Other (See Comments)    Abdominal pain   Loestrin [Norethindrone Acet-Ethinyl Est] Hives   Maxalt [Rizatriptan Benzoate] Other (See Comments)    Not effective   Nortriptyline Hcl Other (See Comments)    Not effective: Lack Therapeutic Effect   Sumatriptan Other (See Comments)    Felt like acid was in her brain, if given IM   Zomig [Zolmitriptan] Other (See Comments)    Not effective     Social History:   Social History   Socioeconomic History   Marital status: Single    Spouse  name: Not on file   Number of children: Not on file   Years of education: Not on file   Highest education level: Not on file  Occupational History   Occupation: energy manager  Tobacco Use   Smoking status: Never   Smokeless tobacco: Never  Vaping Use   Vaping status: Never Used  Substance and Sexual Activity   Alcohol use: Yes    Comment: occ   Drug use: No   Sexual activity: Yes    Birth control/protection: I.U.D.  Other Topics Concern   Not on file  Social History Narrative   Not on file   Social Drivers of Health   Financial Resource Strain: Low Risk  (02/02/2023)   Received from [facility]   Overall Financial Resource Strain (CARDIA)    Difficulty of Paying Living Expenses: Not very hard  Food Insecurity: Food Insecurity Present (02/02/2023)   [facility]   Hunger Vital Sign    Worried About Running Out of Food in the Last Year:  Sometimes true    Ran Out of Food in the Last Year: Never true  Transportation Needs: No Transportation Needs (02/02/2023)   Received [facility] - Transportation    Lack of Transportation (Medical): No    Lack of Transportation (Non-Medical): No  Physical Activity: Insufficiently Active (02/02/2023)   Received from [facility]   Exercise Vital Sign    Days of Exercise per Week: 1 day    Minutes of Exercise per Session: 30 min  Stress: No Stress Concern Present (02/02/2023)   Received from [facility] - Occupational Stress Questionnaire    Feeling of Stress : Only a little  Social Connections: Somewhat Isolated (02/02/2023)   Received from [facility]   Social Network    How would you rate your social network (family, work, friends)?: Restricted participation with some degree of social isolation  Intimate Partner Violence: Not At Risk (02/02/2023)   Received from [facility]   HITS    Over the last 12 months how often did your partner physically hurt  you?: Never    Over the last 12 months how often did your partner insult you or talk down to you?: Never    Over the last 12 months how often did your partner threaten you with physical harm?: Never    Over the last 12 months how often did your partner scream or curse at you?: Never    Family History:   No family history on file.   ROS:  Please see the history of present illness.   All other ROS reviewed and negative.     Physical Exam/Data:   Vitals:   08/07/23 2152 08/08/23 0115 08/08/23 0421 08/08/23 0422  BP: 92/66 110/64 (P) 121/81 121/81  Pulse: (!) 123 97 (P) 76 76  Resp: (!) 23 20  20   Temp: (!) 100.6 F (38.1 C) 99.2 F (37.3 C) (P) 98.7 F (37.1 C) 98.7 F (37.1 C)  TempSrc: Oral Oral (P) Oral Oral  SpO2: 95% 96%  97%    Intake/Output Summary (Last 24 hours) at 08/08/2023 0815 Last data filed at 08/08/2023 0535 Gross per 24 hour  Intake 1200 ml  Output --  Net 1200 ml      06/06/2023   12:38 PM 04/01/2023    4:18 PM 10/25/2022    5:42 PM  Last 3 Weights  Weight (lbs) 374 lb 12.5 oz 375 lb 354 lb 15.1 oz  Weight (kg) 170 kg 170.099 kg 161 kg     There is no height or weight on file to calculate BMI.  General:  obese female in NAD HEENT: normal Neck: no JVD Vascular: No carotid bruits; Distal pulses 2+ bilaterally Cardiac:  normal S1, S2; RRR; no murmur  Lungs:  clear to auscultation bilaterally, no wheezing, rhonchi or rales  Abd: soft, nontender, no hepatomegaly  Ext: no edema Musculoskeletal:  No deformities, BUE and BLE strength normal and equal Skin: warm and dry  Neuro:  CNs 2-12 intact, no focal abnormalities noted Psych:  Normal affect   EKG:  The EKG was personally reviewed and demonstrates:  pending Telemetry:  Telemetry was personally reviewed and demonstrates:  NA  Relevant CV Studies:  Echo pending  Echo 2018 Study Conclusions   - Left ventricle: The cavity size was normal. Wall thickness was    normal. Systolic function was normal.  The estimated ejection    fraction was in the range of 50%  to 55%. Left ventricular    diastolic function parameters were normal.  - Left atrium: The atrium was mildly dilated.  - Atrial septum: No obvious PFO by 2D and color flow no bubble    study performed.   Laboratory Data:  High Sensitivity Troponin:  No results for input(s): TROPONINIHS in the last 720 hours.   Chemistry Recent Labs  Lab 08/07/23 2156  NA 133*  K 4.5  CL 102  CO2 19*  GLUCOSE 106*  BUN 18  CREATININE 0.87  CALCIUM  8.7*  GFRNONAA >60  ANIONGAP 12    Recent Labs  Lab 08/07/23 2156  PROT 8.4*  ALBUMIN 4.1  AST 26  ALT 20  ALKPHOS 63  BILITOT 1.8*   Lipids No results for input(s): CHOL, TRIG, HDL, LABVLDL, LDLCALC, CHOLHDL in the last 168 hours.  Hematology Recent Labs  Lab 08/07/23 2156  WBC 9.0  RBC 4.82  HGB 13.9  HCT 42.3  MCV 87.8  MCH 28.8  MCHC 32.9  RDW 13.7  PLT 308   Thyroid  No results for input(s): TSH, FREET4 in the last 168 hours.  BNPNo results for input(s): BNP, PROBNP in the last 168 hours.  DDimer No results for input(s): DDIMER in the last 168 hours.   Radiology/Studies:  US  Abdomen Limited RUQ (LIVER/GB) Result Date: 08/08/2023 CLINICAL DATA:  Right upper quadrant pain. EXAM: ULTRASOUND ABDOMEN LIMITED RIGHT UPPER QUADRANT COMPARISON:  None Available. FINDINGS: Gallbladder: A 3.3 cm shadowing echogenic gallstone is seen within the gallbladder lumen. The gallbladder wall is limited in evaluation. A positive sonographic Beverley sign is noted by sonographer. Common bile duct: Diameter: 3.0 mm Liver: No focal lesion identified. Diffusely increased echogenicity of the liver parenchyma is noted. Portal vein is patent on color Doppler imaging with normal direction of blood flow towards the liver. Other: None. IMPRESSION: 1. Cholelithiasis, in the setting of a positive sonographic Murphy sign which is suggestive of acute cholecystitis. 2. Hepatic steatosis.  Electronically Signed   By: Suzen Dials M.D.   On: 08/08/2023 01:20     Assessment and Plan:   Chronic systolic and diastolic heart failure History of multiple medication intolerances - maintained on 50 mg toprol  - no other GDMT - she has not required a standing diuretic and has had no hospitalizations for CHF exacerbation - she never started spironolactone , sometimes has dependent edema at the end of a long day - does not appear volume up   PFO - found no bubble study with mild shunting   Palpitations PVCs Treated with metoprolol  50 mg - not on telemetry - obtain EKG   OHS/OSA - CPAP has been ordered   Acute cholecystitis - with LFT elevation - general surgery consulted - MRCP planned   Morbid obesity BMI 55   Preoperative risk evaluation for Mace prior to cholecystectomy According to the RCRI, she has a 0.9% risk of Mace-conservatively given one point for history of HFmrEF.  She is able to complete greater than 4.0 METS without angina.  She is not volume up on exam.  She has no history of ACS, PCI, or stroke.  She does not require insulin. -Please obtain EKG -Will await formal echo to ensure no change in her cardiac structure and function -If echo and EKG are reassuring, no further cardiac testing needed prior to surgery   Risk Assessment/Risk Scores:         For questions or updates, please contact [facility] Please consult [email or web address] for contact info under  Signed, Jon Garre Ramar Nobrega, PA  08/08/2023 8:15 AM

## 2023-08-08 NOTE — Anesthesia Procedure Notes (Signed)
 Procedure Name: Intubation Date/Time: 08/08/2023 3:53 PM  Performed by: Harl Armida PARAS, CRNAPre-anesthesia Checklist: Emergency Drugs available, Suction available, Patient identified and Patient being monitored Patient Re-evaluated:Patient Re-evaluated prior to induction Oxygen Delivery Method: Circle system utilized Preoxygenation: Pre-oxygenation with 100% oxygen Induction Type: IV induction Ventilation: Mask ventilation without difficulty Laryngoscope Size: Mac and 3 Grade View: Grade I Tube type: Oral Tube size: 7.5 mm Number of attempts: 1 Airway Equipment and Method: Stylet and Patient positioned with wedge pillow Placement Confirmation: ETT inserted through vocal cords under direct vision, positive ETCO2 and breath sounds checked- equal and bilateral Secured at: 22 cm Tube secured with: Tape Dental Injury: Teeth and Oropharynx as per pre-operative assessment

## 2023-08-08 NOTE — ED Provider Notes (Signed)
 [place] EMERGENCY DEPARTMENT AT [facility] Provider Note   CSN: [id number] Arrival date & time: 08/07/23  2034     History  Chief: Abdominal pain  Kathleen Montoya is a 43 y.o. female.  The history is provided by the patient.  She has history of polycystic ovarian syndrome, patent foramen ovale, GERD, cholelithiasis and comes in because of upper abdominal pain which started last night.  Patient woke her up at about 3 AM.  There is associated nausea and vomiting.  She has also had fever as high as 101.6.  She denies chills or sweats.  Pain does radiate to the back.   Home Medications Prior to Admission medications   Medication Sig Start Date End Date Taking? Authorizing Provider  albuterol  (VENTOLIN  HFA) 108 (90 Base) MCG/ACT inhaler INHALE 1 PUFF BY MOUTH AS DIRECTED Patient not taking: Reported on 04/01/2023 02/04/20   Jeffrie Oneil BROCKS, MD  amoxicillin -clavulanate (AUGMENTIN ) 875-125 MG tablet Take 1 tablet by mouth every 12 (twelve) hours. Patient not taking: Reported on 04/01/2023 10/25/22   Ladora Congress, PA  Cholecalciferol (VITAMIN D) 125 MCG (5000 UT) CAPS Take 1 capsule by mouth daily.    [provider]  diclofenac  Sodium (VOLTAREN ) 1 % GEL Apply 4 g topically 4 (four) times daily. Patient not taking: Reported on 12/25/2021 10/19/20   Neldon Hamp RAMAN, PA  etonogestrel-ethinyl estradiol (NUVARING) 0.12-0.015 MG/24HR vaginal ring Place 1 each vaginally every 28 (twenty-eight) days. 1 ring leave in place for 3 weeks, remove, and replace with a new ring after 7 day break Patient not taking: Reported on 12/25/2021 10/03/20   [provider]  HYDROcodone -acetaminophen  (NORCO/VICODIN) 5-325 MG tablet Take 1 tablet by mouth every 6 (six) hours as needed for pain. Patient not taking: Reported on 12/25/2021 10/16/20   [provider]  methocarbamol  (ROBAXIN ) 750 MG tablet Take 750 mg by mouth 3 (three) times daily. Patient not taking: Reported on 12/25/2021  10/16/20   [provider]  metoprolol  succinate (TOPROL -XL) 50 MG 24 hr tablet TAKE WITH OR IMMEDIATELY FOLLOWING A MEAL 06/12/23   Jeffrie Oneil BROCKS, MD  Multiple Vitamin (MULTIVITAMIN) capsule Take 1 capsule by mouth daily.    [provider]  naproxen  (NAPROSYN ) 375 MG tablet Take 1 tablet (375 mg total) by mouth 2 (two) times daily. 06/06/23   Randol Simmonds, MD  naratriptan (AMERGE) 2.5 MG tablet Take 2.5 mg by mouth as needed for migraine. Patient not taking: Reported on 04/01/2023 05/19/14   [provider]  omeprazole (PRILOSEC OTC) 20 MG tablet Take 20 mg by mouth every other day.    [provider]  ondansetron  (ZOFRAN -ODT) 8 MG disintegrating tablet Take 1 tablet (8 mg total) by mouth every 8 (eight) hours as needed for nausea or vomiting. 06/06/23   Randol Simmonds, MD  oxyCODONE -acetaminophen  (PERCOCET/ROXICET) 5-325 MG tablet Take 1 tablet by mouth every 6 (six) hours as needed for severe pain (pain score 7-10). 06/06/23   Randol Simmonds, MD  predniSONE (DELTASONE) 20 MG tablet Take 20-60 mg by mouth every other day. Take 3 tablets on day 1-3, take 2 tablets on day 4-6, take 1 tablet on day 7-9. Patient not taking: Reported on 12/25/2021 10/16/20   [provider]  promethazine (PHENERGAN) 25 MG tablet Take 25 mg by mouth every 6 (six) hours as needed for nausea.    [provider]  rizatriptan (MAXALT) 10 MG tablet Take 10 mg by mouth as needed for migraine.  [provider]  spironolactone  (ALDACTONE ) 25 MG tablet Take 1 tablet (25 mg total) by mouth daily. 04/01/23   Jeffrie Oneil BROCKS, MD  Ubrogepant (UBRELVY) 100 MG TABS Take 100 mg by mouth as needed (migraine).    [provider]      Allergies    Clindamycin, Topiramate, Amitriptyline, Clindamycin/lincomycin, Erythromycin, Loestrin [norethindrone acet-ethinyl est], Maxalt [rizatriptan benzoate], Norethindrone-eth estradiol, Nortriptyline hcl, Sumatriptan, and Zomig [zolmitriptan]     Review of Systems   Review of Systems  All other systems reviewed and are negative.   Physical Exam Updated Vital Signs BP 110/64   Pulse 97   Temp 99.2 F (37.3 C) (Oral)   Resp 20   SpO2 96%  Physical Exam Vitals and nursing note reviewed.   Morbidly obese 43 year old female, appears to be in pain, but is in no acute distress. Vital signs are normal. Oxygen saturation is 96%, which is normal. Head is normocephalic and atraumatic. PERRLA, EOMI. Oropharynx is clear. Neck is nontender and supple without adenopathy or JVD. Back is nontender and there is no CVA tenderness. Lungs are clear without rales, wheezes, or rhonchi. Chest is nontender. Heart has regular rate and rhythm without murmur. Abdomen is obese, soft, with moderate to severe epigastric and right upper quadrant tenderness.  There is no rebound or guarding. Neurologic: Mental status is normal, cranial nerves are intact, moves all extremities equally.  ED Results / Procedures / Treatments   Labs (all labs ordered are listed, but only abnormal results are displayed) Labs Reviewed  COMPREHENSIVE METABOLIC PANEL - Abnormal; Notable for the following components:      Result Value   Sodium 133 (*)    CO2 19 (*)    Glucose, Bld 106 (*)    Calcium  8.7 (*)    Total Protein 8.4 (*)    Total Bilirubin 1.8 (*)    All other components within normal limits  CBC WITH DIFFERENTIAL/PLATELET - Abnormal; Notable for the following components:   Neutro Abs 7.9 (*)    Lymphs Abs 0.6 (*)    All other components within normal limits  URINALYSIS, ROUTINE W REFLEX MICROSCOPIC - Abnormal; Notable for the following components:   APPearance HAZY (*)    All other components within normal limits  LIPASE, BLOOD  HCG, SERUM, QUALITATIVE    EKG None  Radiology US  Abdomen Limited RUQ (LIVER/GB) Result Date: 08/08/2023 CLINICAL DATA:  Right upper quadrant pain. EXAM: ULTRASOUND ABDOMEN LIMITED RIGHT UPPER QUADRANT COMPARISON:  None  Available. FINDINGS: Gallbladder: A 3.3 cm shadowing echogenic gallstone is seen within the gallbladder lumen. The gallbladder wall is limited in evaluation. A positive sonographic Beverley sign is noted by sonographer. Common bile duct: Diameter: 3.0 mm Liver: No focal lesion identified. Diffusely increased echogenicity of the liver parenchyma is noted. Portal vein is patent on color Doppler imaging with normal direction of blood flow towards the liver. Other: None. IMPRESSION: 1. Cholelithiasis, in the setting of a positive sonographic Murphy sign which is suggestive of acute cholecystitis. 2. Hepatic steatosis. Electronically Signed   By: Suzen Dials M.D.   On: 08/08/2023 01:20    Procedures Procedures    Medications Ordered in ED Medications  acetaminophen  (TYLENOL ) tablet 1,000 mg (1,000 mg Oral Given 08/07/23 2227)  ondansetron  (ZOFRAN -ODT) disintegrating tablet 4 mg (4 mg Oral Given 08/07/23 2228)    ED Course/ Medical Decision Making/ A&P  Medical Decision Making Risk Prescription drug management. Decision regarding hospitalization.   Upper abdominal pain with fever and vomiting concerning for acute cholecystitis.  Differential diagnosis includes, but is not limited to, choledocholithiasis with cholangitis, pancreatitis, diverticulitis, peptic ulcer disease.  This is a differential which includes conditions with significant risk for morbidity and complications.  I have reviewed her laboratory tests, and my interpretation is mild hyponatremia which is not clinically significant, mildly elevated random glucose which will need to be followed as an outpatient, normal alkaline phosphatase and transaminases, elevated total bilirubin which is new compared with 06/06/2023, normal CBC although there is a left shift on differential, normal urinalysis.  Right upper quadrant ultrasound shows cholelithiasis with positive sonographic Murphy sign suggestive of acute  cholecystitis.  I have reviewed her past records, and she was seen in the emergency department on 12th/11/2022 at which time ultrasound showed 3.6 cm gallstone and negative sonographic Murphy sign.  With fever and elevated bilirubin, she needs to be admitted for IV antibiotics and surgical consultation.  I have ordered a dose of morphine  for pain and ondansetron  for nausea.  I have ordered antibiotics of ceftriaxone  and metronidazole .  I have discussed the case with Dr. Laveda of [facility] who agrees to see the patient in consultation but feels that patient should be admitted to the surgery service.  I have discussed case with Dr. Sheldon of general surgery service who agrees to see the patient in consultation but requests patient be admitted to the hospitalist's service.  Final Clinical Impression(s) / ED Diagnoses Final diagnoses:  Acute cholecystitis due to biliary calculus  Serum total bilirubin elevated    Rx / DC Orders ED Discharge Orders     None         Raford Lenis, MD 08/08/23 [phone]

## 2023-08-08 NOTE — Discharge Instructions (Signed)
 [facility] ______[facility] LAPAROSCOPIC SURGERY: POST OP INSTRUCTIONS Always review your discharge instruction sheet given to you by the facility where your surgery was performed. IF YOU HAVE DISABILITY OR FAMILY LEAVE FORMS, YOU MUST BRING THEM TO THE OFFICE FOR PROCESSING.   DO NOT GIVE THEM TO YOUR DOCTOR.  A prescription for pain medication may be given to you upon discharge.  Take your pain medication as prescribed, if needed.  If narcotic pain medicine is not needed, then you may take acetaminophen (Tylenol) or ibuprofen (Advil) as needed. Take your usually prescribed medications unless otherwise directed. If you need a refill on your pain medication, please contact your pharmacy.  They will contact our office to request authorization. Prescriptions will not be filled after 5pm or on week-ends. You should follow a light diet the first few days after arrival home, such as soup and crackers, etc.  Be sure to include lots of fluids daily. Most patients will experience some swelling and bruising in the area of the incisions.  Ice packs will help.  Swelling and bruising can take several days to resolve.  It is common to experience some constipation if taking pain medication after surgery.  Increasing fluid intake and taking a stool softener (such as Colace) will usually help or prevent this problem from occurring.  A mild laxative (Milk of Magnesia or Miralax) should be taken according to package instructions if there are no bowel movements after 48 hours. Unless discharge instructions indicate otherwise, you may remove your bandages 24-48 hours after surgery, and you may shower at that time.  You may have steri-strips (small skin tapes) in place directly over the incision.  These strips should be left on the skin for 7-10 days.  If your surgeon used skin glue on the incision, you may shower in 24 hours.  The glue will flake off over the next 2-3 weeks.  Any sutures or staples will be  removed at the office during your follow-up visit. ACTIVITIES:  You may resume regular (light) daily activities beginning the next day--such as daily self-care, walking, climbing stairs--gradually increasing activities as tolerated.  You may have sexual intercourse when it is comfortable.  Refrain from any heavy lifting or straining until approved by your doctor. You may drive when you are no longer taking prescription pain medication, you can comfortably wear a seatbelt, and you can safely maneuver your car and apply brakes. RETURN TO WORK:  __________________________________________________________ Kathleen Montoya should see your doctor in the office for a follow-up appointment approximately 2-3 weeks after your surgery.  Make sure that you call for this appointment within a day or two after you arrive home to insure a convenient appointment time. OTHER INSTRUCTIONS: __________________________________________________________________________________________________________________________ __________________________________________________________________________________________________________________________ WHEN TO CALL YOUR DOCTOR: Fever over 101.0 Inability to urinate Continued bleeding from incision. Increased pain, redness, or drainage from the incision. Increasing abdominal pain  The clinic staff is available to answer your questions during regular business hours.  Please dont hesitate to call and ask to speak to one of the nurses for clinical concerns.  If you have a medical emergency, go to the nearest emergency room or call 911.  A surgeon from [facility] is always on call at the hospital. [address], [address], [place], [place]  [address] ? [address] [address], [place], [place]   [address] [phone] ? [phone] ? FAX [phone] Web site: [email or web address]    Managing Your Pain After Surgery Without Opioids    Thank you for  participating in our program to  help patients manage their pain after surgery without opioids. This is part of our effort to provide you with the best care possible, without exposing you or your family to the risk that opioids pose.  What pain can I expect after surgery? You can expect to have some pain after surgery. This is normal. The pain is typically worse the day after surgery, and quickly begins to get better. Many studies have found that many patients are able to manage their pain after surgery with Over-the-Counter (OTC) medications such as Tylenol and Motrin. If you have a condition that does not allow you to take Tylenol or Motrin, notify your surgical team.  How will I manage my pain? The best strategy for controlling your pain after surgery is around the clock pain control with Tylenol (acetaminophen) and Motrin (ibuprofen or Advil). Alternating these medications with each other allows you to maximize your pain control. In addition to Tylenol and Motrin, you can use heating pads or ice packs on your incisions to help reduce your pain.  How will I alternate your regular strength over-the-counter pain medication? You will take a dose of pain medication every three hours. Start by taking 650 mg of Tylenol (2 pills of 325 mg) 3 hours later take 600 mg of Motrin (3 pills of 200 mg) 3 hours after taking the Motrin take 650 mg of Tylenol 3 hours after that take 600 mg of Motrin.   - 1 -  See example - if your first dose of Tylenol is at 12:00 PM   12:00 PM Tylenol 650 mg (2 pills of 325 mg)  3:00 PM Motrin 600 mg (3 pills of 200 mg)  6:00 PM Tylenol 650 mg (2 pills of 325 mg)  9:00 PM Motrin 600 mg (3 pills of 200 mg)  Continue alternating every 3 hours   We recommend that you follow this schedule around-the-clock for at least 3 days after surgery, or until you feel that it is no longer needed. Use the table on the last page of this handout to keep track of the medications you are taking. Important: Do not take  more than 3000mg  of Tylenol or 3200mg  of Motrin in a 24-hour period. Do not take ibuprofen/Motrin if you have a history of bleeding stomach ulcers, severe kidney disease, &/or actively taking a blood thinner  What if I still have pain? If you have pain that is not controlled with the over-the-counter pain medications (Tylenol and Motrin or Advil) you might have what we call breakthrough pain. You will receive a prescription for a small amount of an opioid pain medication such as Oxycodone, Tramadol, or Tylenol with Codeine. Use these opioid pills in the first 24 hours after surgery if you have breakthrough pain. Do not take more than 1 pill every 4-6 hours.  If you still have uncontrolled pain after using all opioid pills, don't hesitate to call our staff using the number provided. We will help make sure you are managing your pain in the best way possible, and if necessary, we can provide a prescription for additional pain medication.   Day 1    Time  Name of Medication Number of pills taken  Amount of Acetaminophen  Pain Level   Comments  AM PM       AM PM       AM PM       AM PM       AM PM  AM PM       AM PM       AM PM       Total Daily amount of Acetaminophen Do not take more than  3,000 mg per day      Day 2    Time  Name of Medication Number of pills taken  Amount of Acetaminophen  Pain Level   Comments  AM PM       AM PM       AM PM       AM PM       AM PM       AM PM       AM PM       AM PM       Total Daily amount of Acetaminophen Do not take more than  3,000 mg per day      Day 3    Time  Name of Medication Number of pills taken  Amount of Acetaminophen  Pain Level   Comments  AM PM       AM PM       AM PM       AM PM          AM PM       AM PM       AM PM       AM PM       Total Daily amount of Acetaminophen Do not take more than  3,000 mg per day      Day 4    Time  Name of Medication Number of pills taken  Amount of  Acetaminophen  Pain Level   Comments  AM PM       AM PM       AM PM       AM PM       AM PM       AM PM       AM PM       AM PM       Total Daily amount of Acetaminophen Do not take more than  3,000 mg per day      Day 5    Time  Name of Medication Number of pills taken  Amount of Acetaminophen  Pain Level   Comments  AM PM       AM PM       AM PM       AM PM       AM PM       AM PM       AM PM       AM PM       Total Daily amount of Acetaminophen Do not take more than  3,000 mg per day       Day 6    Time  Name of Medication Number of pills taken  Amount of Acetaminophen  Pain Level  Comments  AM PM       AM PM       AM PM       AM PM       AM PM       AM PM       AM PM       AM PM       Total Daily amount of Acetaminophen Do not take more than  3,000 mg per day      Day 7    Time  Name of Medication Number of pills taken  Amount of Acetaminophen  Pain Level   Comments  AM PM       AM PM       AM PM       AM PM       AM PM       AM PM       AM PM       AM PM       Total Daily amount of Acetaminophen Do not take more than  3,000 mg per day        For additional information about how and where to safely dispose of unused opioid medications - [email or web address]  Disclaimer: This document contains information and/or instructional materials adapted from [facility] for the typical patient with your condition. It does not replace medical advice from your health care provider because your experience may differ from that of the typical patient. Talk to your health care provider if you have any questions about this document, your condition or your treatment plan. Adapted from [facility]

## 2023-08-08 NOTE — Anesthesia Preprocedure Evaluation (Addendum)
 Anesthesia Evaluation  Patient identified by MRN, date of birth, ID band Patient awake    Reviewed: Allergy & Precautions, NPO status , Patient's Chart, lab work & pertinent test results  History of Anesthesia Complications Negative for: history of anesthetic complications  Airway Mallampati: II  TM Distance: >3 FB Neck ROM: Full    Dental no notable dental hx.    Pulmonary asthma    Pulmonary exam normal        Cardiovascular +CHF  Normal cardiovascular exam     Neuro/Psych  Headaches    GI/Hepatic Neg liver ROS,GERD  Medicated,,  Endo/Other    Class 4 obesity  Renal/GU negative Renal ROS     Musculoskeletal negative musculoskeletal ROS (+)    Abdominal   Peds  Hematology negative hematology ROS (+)   Anesthesia Other Findings Day of surgery medications reviewed with patient.  Reproductive/Obstetrics                             Anesthesia Physical Anesthesia Plan  ASA: 3  Anesthesia Plan: General   Post-op Pain Management: Tylenol  PO (pre-op)*   Induction: Intravenous  PONV Risk Score and Plan: 3 and Ondansetron , Dexamethasone , Treatment may vary due to age or medical condition, Midazolam , Scopolamine patch - Pre-op and Propofol  infusion  Airway Management Planned: Oral ETT  Additional Equipment: None  Intra-op Plan:   Post-operative Plan: Extubation in OR  Informed Consent: I have reviewed the patients History and Physical, chart, labs and discussed the procedure including the risks, benefits and alternatives for the proposed anesthesia with the patient or authorized representative who has indicated his/her understanding and acceptance.     Dental advisory given  Plan Discussed with: CRNA  Anesthesia Plan Comments:        Anesthesia Quick Evaluation

## 2023-08-08 NOTE — Transfer of Care (Signed)
 Immediate Anesthesia Transfer of Care Note  Patient: Kathleen Montoya  Procedure(s) Performed: LAPAROSCOPIC CHOLECYSTECTOMY WITH CHOLANGIOGRAM  Patient Location: PACU  Anesthesia Type:General  Level of Consciousness: awake, alert , and oriented  Airway & Oxygen Therapy: Patient Spontanous Breathing and Patient connected to face mask oxygen  Post-op Assessment: Report given to RN and Post -op Vital signs reviewed and stable  Post vital signs: Reviewed and stable  Last Vitals:  Vitals Value Taken Time  BP 122/74 08/08/23 1711  Temp    Pulse 73 08/08/23 1712  Resp 15 08/08/23 1712  SpO2 99 % 08/08/23 1712  Vitals shown include unfiled device data.  Last Pain:  Vitals:   08/08/23 1350  TempSrc: Oral  PainSc: 3       Patients Stated Pain Goal: 0 (08/08/23 1350)  Complications: No notable events documented.

## 2023-08-08 NOTE — Progress Notes (Signed)
 Brief same day note:  Patient is a 43 year old female with history of morbid obesity, PFO, combined systolic/diastolic CHF who presented to the ED with complaint of nausea, upper abdominal discomfort, vomiting.  History of cholelithiasis.  Also reported fever.  On presentation, she was hemodynamically stable, afebrile.  Lab work showed T. bili of 1.8.  Right upper quadrant ultrasound showed cholelithiasis with positive Murphy sign.  General surgery consulted, plan for cholecystectomy.  Cardiology consulted for clearance.  Patient seen and examined at bedside today.  Hemodynamically stable during my evaluation.  Right upper quadrant pain/epigastric not that severe.  Waiting for decision for surgery  Assessment and plan:  Acute cholecystitis/cholelithiasis: Presented with abdomen pain, nausea, vomiting.  Liver enzymes stable.  Currently NPO.  Continue broad spectrum antibiotics for now.  General surgery consulted, possible plan for cholecystectomy today or tomorrow  History of combined systolic/diastolic CHF/PFO: Follows with cardiology.  On metoprolol , aspirin at home.  Currently appears euvolemic.  Echo done here showed EF of 55%, no wall motion abnormality, normal left ventricular diastolic parameters  Morbid obesity/likely undiagnosed OSA/OHS: CPAP ordered.

## 2023-08-08 NOTE — Consult Note (Addendum)
 Kathleen Montoya  10/17/1980 [id number]  CARE TEAM:  PCP: Teresa Channel, MD  Outpatient Care Team: Patient Care Team: Teresa Channel, MD as PCP - General (Family Medicine) Jeffrie Oneil BROCKS, MD as PCP - Cardiology (Cardiology) Jeffrie Oneil BROCKS, MD as Consulting Physician (Cardiology) Sebastian Moles, MD as Consulting Physician (General Surgery) Goforth, Helayne ORN, MD as Referring Physician (Neurology) Burnette Fallow, MD as Consulting Physician (Gastroenterology)  Inpatient Treatment Team: Treatment Team:  Raford Lenis, MD Netta Newell PARAS, EMT Con Leonette BIRCH, NT Bobbette Hercules, MD Ccs, Md, MD   This patient is a 43 y.o.female who presents today for surgical evaluation at the request of Dr Raford, [facility] ED.   Chief complaint / Reason for evaluation: Recurrent biliary colic, probable cholecystitis  43 year old female.  Morbidly obese with BMI 55.  History of palpitations and tachycardia and PVCs followed by Dr. Jeffrie with cardiology on metoprolol .  Discussion of some mild chronic heart failure.  History of at least childhood asthma with some concern for reactive airway disease.  Heartburn/GERD.  Had seen [facility] in the past.  Claims allergies to numerous medications although it looks like most of these are just a feeling that they do not work or intolerances.  Had an episode of abdominal pain and went to emergency department last year.  Gallbladder etiology suspected.  Followed up and consulted with Dr. Sebastian with our group a couple months ago.  He suspected biliary colic and recommended cholecystectomy.  Patient wanted to try diet modifications and reevaluate in a few months.  Also had upper abdominal epigastric hernia.  Patient had episode of abdominal pain biliary colic but has been persistent for the past few days.  Progressing to nausea and vomiting and not able to keep anything down.  Fever.  Comes to the ER tonight with persistent pain.  Liver functions  test slightly elevated.  While there is fever there is no leukocytosis but patient uncomfortable requiring narcotics repeatedly.  Surgical consultation requested for probable cholecystitis.  Looks like patient has tried to explore weight loss treatments/interventions but has had issues with insurance and funding to get on a regular plan.  Have been followed by [facility].  EGD a decade ago raise concern of some nonerosive gastroesophageal reflux disease.  Has been on PPI for over a decade.  Omeprazole right now.  Does not sound like heartburn reflux severe issues at this time.   Assessment  Kathleen Montoya  43 y.o. female       Problem List:  Principal Problem:   Acute on chronic cholecystitis Active Problems:   Morbid obesity with BMI of 50.0-59.9, adult (HCC)   Palpitations   Gastroesophageal reflux disease without esophagitis   Insomnia   Hypercholesteremia   Tachycardia   LFT elevation   Epigastric hernia   Multiple drug allergies   Chronic combined systolic and diastolic congestive heart failure (HCC)   Hepatic steatosis   Persistent biliary colic suspicious for cholecystitis  Plan:  Medical admission.  It would be helpful to make sure she does not have any acute cardiac or pulmonary issues.  She did come in tachycardic but seems to have stabilized.  Looks like Dr. Jeffrie ordered an echocardiogram for last year but that did not happen?  He had discussed congestive heart failure on his note fall 2024.  Does not seem severe.  Just double check to be safe given her morbid obesity and need for surgery  IV antibiotics.  Will do ceftriaxone  for  now.  Nausea and pain control.  IV fluid resuscitation.  Trend LFTs.  May just reflect hepatic steatosis.  Bilirubin only 1.8 = not severely high.  As long as bilirubin less than 4, will proceed with cholangiogram and laparoscopic cholecystectomy first   Family history of diabetes but no hyperglycemia at this  time.  Keep on PPI for now.  History of reactive airway disease but does not seem to be severely hypoxic.  Albuterol  as needed  If no medical concerns, most likely proceed with cholecystectomy.  Laparoscopic approach.  See if can do cholangiograms given the slightly elevated LFTs.  I have tentatively booked this for later this morning pending medical clearance.  This is the second time she is coming to the ER.  She seemed hesitant to proceed with surgery on the office consultation.  I do not think we should delay since she keeps failing outpatient management    I reviewed nursing notes, ED provider notes, last 24 h vitals and pain scores, last 48 h intake and output, last 24 h labs and trends, last 24 h imaging results, and GI, cardiology, primary care notes . I have reviewed this patient's available data, including medical history, events of note, test results, etc as part of my evaluation.  A significant portion of that time was spent in counseling.  Care during the described time interval was provided by me.  This care required moderate level of medical decision making.  08/08/2023  Elspeth KYM Schultze, MD, FACS, MASCRS Esophageal, Gastrointestinal & Colorectal Surgery Robotic and Minimally Invasive Surgery  [facility] [address], [address] [place], [place] [address] [phone] Fax [phone] Main  CONTACT INFORMATION: Weekday (9AM-5PM): Call [facility] main office at [phone] Weeknight (5PM-9AM) or Weekend/Holiday: Check EPIC Web Links tab & use AMION (password  [email or web address]) for General Surgery [facility] coverage  Please, DO NOT use SecureChat  (it is not reliable communication to reach operating surgeons & will lead to a delay in care).   Epic staff messaging available for outptient concerns needing 1-2 business day response.      08/08/2023      Past Medical History:  Diagnosis Date   Abscess of buttock 08/08/2023    Childhood asthma    History of depression    In college   History of HPV infection    History of sciatica 08/08/2023   Hypercholesteremia    Insomnia    Low back pain    Migraines    Multiple drug allergies 08/08/2023   Ovarian cyst    Patent foramen ovale    Pneumonia 05/2005   Polycystic ovarian disease    Tachycardia    Vitamin D deficiency     Past Surgical History:  Procedure Laterality Date   TONSILLECTOMY     WISDOM TOOTH EXTRACTION      Social History   Socioeconomic History   Marital status: Single    Spouse name: Not on file   Number of children: Not on file   Years of education: Not on file   Highest education level: Not on file  Occupational History   Occupation: energy manager  Tobacco Use   Smoking status: Never   Smokeless tobacco: Never  Vaping Use   Vaping status: Never Used  Substance and Sexual Activity   Alcohol use: Yes    Comment: occ   Drug use: No   Sexual activity: Yes    Birth control/protection: I.U.D.  Other  Topics Concern   Not on file  Social History Narrative   Not on file   Social Drivers of Health   Financial Resource Strain: Low Risk  (02/02/2023)   [facility]   Overall Financial Resource Strain (CARDIA)    Difficulty of Paying Living Expenses: Not very hard  Food Insecurity: Food Insecurity Present (02/02/2023)   Received from [facility]   Hunger Vital Sign    Worried About Running Out of Food in the Last Year: Sometimes true    Ran Out of Food in the Last Year: Never true  Transportation Needs: No Transportation Needs (02/02/2023)   Received from [facility] - Transportation    Lack of Transportation (Medical): No    Lack of Transportation (Non-Medical): No  Physical Activity: Insufficiently Active (02/02/2023)   Received from [facility]   Exercise Vital Sign    Days of Exercise per Week: 1 day    Minutes of Exercise per Session: 30 min  Stress: No Stress Concern Present  (02/02/2023)   Received from [facility] - Occupational Stress Questionnaire    Feeling of Stress : Only a little  Social Connections: Somewhat Isolated (02/02/2023)   Received from [facility]   Social Network    How would you rate your social network (family, work, friends)?: Restricted participation with some degree of social isolation  Intimate Partner Violence: Not At Risk (02/02/2023)   Received from [facility]   HITS    Over the last 12 months how often did your partner physically hurt you?: Never    Over the last 12 months how often did your partner insult you or talk down to you?: Never    Over the last 12 months how often did your partner threaten you with physical harm?: Never    Over the last 12 months how often did your partner scream or curse at you?: Never    No family history on file.  Current Facility-Administered Medications  Medication Dose Route Frequency Provider Last Rate Last Admin   acetaminophen  (TYLENOL ) suppository 650 mg  650 mg Rectal Q6H PRN Sheldon Standing, MD       acetaminophen  (TYLENOL ) tablet 1,000 mg  1,000 mg Oral On Call to OR Sheldon Standing, MD       acetaminophen  (TYLENOL ) tablet 325-650 mg  325-650 mg Oral Q6H PRN Sheldon Standing, MD       alum & mag hydroxide-simeth (MAALOX/MYLANTA) 200-200-20 MG/5ML suspension 30 mL  30 mL Oral Q6H PRN Sheldon Standing, MD       bisacodyl  (DULCOLAX) suppository 10 mg  10 mg Rectal Q12H PRN Sheldon Standing, MD       bismuth  subsalicylate (PEPTO BISMOL) 262 MG/15ML suspension 30 mL  30 mL Oral Q8H PRN Sheldon Standing, MD       bupivacaine  liposome (EXPAREL ) 1.3 % injection 266 mg  20 mL Infiltration Once Sheldon Standing, MD       NOREEN ON 08/09/2023] cefTRIAXone  (ROCEPHIN ) 2 g in sodium chloride  0.9 % 100 mL IVPB  2 g Intravenous Q24H Sheldon Standing, MD       Chlorhexidine  Gluconate Cloth 2 % PADS 6 each  6 each Topical Once Sheldon Standing, MD       And   Chlorhexidine  Gluconate Cloth  2 % PADS 6 each  6 each Topical Once Sheldon Standing, MD       diphenhydrAMINE  (BENADRYL ) injection 12.5-25 mg  12.5-25 mg Intravenous  Q6H PRN Sheldon Standing, MD       [START ON 08/09/2023] feeding supplement (ENSURE PRE-SURGERY) liquid 296 mL  296 mL Oral Once Sheldon Standing, MD       HYDROmorphone  (DILAUDID ) injection 0.5-2 mg  0.5-2 mg Intravenous Q2H PRN Sheldon Standing, MD       lactated ringers  bolus 1,000 mL  1,000 mL Intravenous Once Sheldon Standing, MD       lactated ringers  bolus 1,000 mL  1,000 mL Intravenous Q8H PRN Sheldon Standing, MD       magic mouthwash  15 mL Oral QID PRN Sheldon Standing, MD       menthol -cetylpyridinium (CEPACOL) lozenge 3 mg  1 lozenge Oral PRN Sheldon Standing, MD       methocarbamol  (ROBAXIN ) injection 1,000 mg  1,000 mg Intravenous Q6H PRN Sheldon Standing, MD       metoprolol  tartrate (LOPRESSOR ) injection 5 mg  5 mg Intravenous Q6H PRN Sheldon Standing, MD       metroNIDAZOLE  (FLAGYL ) IVPB 500 mg  500 mg Intravenous Q12H Kayleah Appleyard, MD       naphazoline-glycerin  (CLEAR EYES REDNESS) ophth solution 1-2 drop  1-2 drop Both Eyes QID PRN Sheldon Standing, MD       ondansetron  (ZOFRAN ) injection 4 mg  4 mg Intravenous Q6H PRN Sheldon Standing, MD       oxyCODONE  (Oxy IR/ROXICODONE ) immediate release tablet 5-10 mg  5-10 mg Oral Q4H PRN Sheldon Standing, MD       phenol (CHLORASEPTIC) mouth spray 2 spray  2 spray Mouth/Throat PRN Sheldon Standing, MD       polycarbophil (FIBERCON) tablet 625 mg  625 mg Oral BID Sheldon Standing, MD       prochlorperazine  (COMPAZINE ) injection 5-10 mg  5-10 mg Intravenous Q4H PRN Sheldon Standing, MD       sodium chloride  (OCEAN) 0.65 % nasal spray 1-2 spray  1-2 spray Each Nare Q6H PRN Sheldon Standing, MD       Current Outpatient Medications  Medication Sig Dispense Refill   albuterol  (VENTOLIN  HFA) 108 (90 Base) MCG/ACT inhaler INHALE 1 PUFF BY MOUTH AS DIRECTED (Patient not taking: Reported on 04/01/2023) 8.5 g 3   amoxicillin -clavulanate (AUGMENTIN ) 875-125 MG  tablet Take 1 tablet by mouth every 12 (twelve) hours. (Patient not taking: Reported on 04/01/2023) 14 tablet 0   Cholecalciferol (VITAMIN D) 125 MCG (5000 UT) CAPS Take 1 capsule by mouth daily.     diclofenac  Sodium (VOLTAREN ) 1 % GEL Apply 4 g topically 4 (four) times daily. (Patient not taking: Reported on 12/25/2021) 100 g 1   etonogestrel-ethinyl estradiol (NUVARING) 0.12-0.015 MG/24HR vaginal ring Place 1 each vaginally every 28 (twenty-eight) days. 1 ring leave in place for 3 weeks, remove, and replace with a new ring after 7 day break (Patient not taking: Reported on 12/25/2021)     HYDROcodone -acetaminophen  (NORCO/VICODIN) 5-325 MG tablet Take 1 tablet by mouth every 6 (six) hours as needed for pain. (Patient not taking: Reported on 12/25/2021)     methocarbamol  (ROBAXIN ) 750 MG tablet Take 750 mg by mouth 3 (three) times daily. (Patient not taking: Reported on 12/25/2021)     metoprolol  succinate (TOPROL -XL) 50 MG 24 hr tablet TAKE WITH OR IMMEDIATELY FOLLOWING A MEAL 90 tablet 3   Multiple Vitamin (MULTIVITAMIN) capsule Take 1 capsule by mouth daily.     naproxen  (NAPROSYN ) 375 MG tablet Take 1 tablet (375 mg total) by mouth 2 (two) times daily. 20 tablet 0   naratriptan (AMERGE)  2.5 MG tablet Take 2.5 mg by mouth as needed for migraine. (Patient not taking: Reported on 04/01/2023)  0   omeprazole (PRILOSEC OTC) 20 MG tablet Take 20 mg by mouth every other day.     ondansetron  (ZOFRAN -ODT) 8 MG disintegrating tablet Take 1 tablet (8 mg total) by mouth every 8 (eight) hours as needed for nausea or vomiting. 12 tablet 0   oxyCODONE -acetaminophen  (PERCOCET/ROXICET) 5-325 MG tablet Take 1 tablet by mouth every 6 (six) hours as needed for severe pain (pain score 7-10). 12 tablet 0   predniSONE (DELTASONE) 20 MG tablet Take 20-60 mg by mouth every other day. Take 3 tablets on day 1-3, take 2 tablets on day 4-6, take 1 tablet on day 7-9. (Patient not taking: Reported on 12/25/2021)     promethazine  (PHENERGAN) 25 MG tablet Take 25 mg by mouth every 6 (six) hours as needed for nausea.     rizatriptan (MAXALT) 10 MG tablet Take 10 mg by mouth as needed for migraine.     spironolactone  (ALDACTONE ) 25 MG tablet Take 1 tablet (25 mg total) by mouth daily. 90 tablet 3   Ubrogepant (UBRELVY) 100 MG TABS Take 100 mg by mouth as needed (migraine).       Allergies  Allergen Reactions   Clindamycin Hives   Topiramate Other (See Comments)    word finding difficulty   Amitriptyline Other (See Comments)    Not effective: Lack of Therapeutic effect   Clindamycin/Lincomycin Rash   Erythromycin Other (See Comments)    Abdominal pain   Loestrin [Norethindrone Acet-Ethinyl Est] Hives   Maxalt [Rizatriptan Benzoate] Other (See Comments)    Not effective   Norethindrone-Eth Estradiol Hives   Nortriptyline Hcl Other (See Comments)    Not effective: Lack Therapeutic Effect   Sumatriptan Other (See Comments)    Felt like acid was in her brain   Zomig [Zolmitriptan] Other (See Comments)    Not effective      BP 110/64   Pulse 97   Temp 99.2 F (37.3 C) (Oral)   Resp 20   SpO2 96%     Results:   Labs: Results for orders placed or performed during the hospital encounter of 08/07/23 (from the past 48 hours)  Comprehensive metabolic panel     Status: Abnormal   Collection Time: 08/07/23  9:56 PM  Result Value Ref Range   Sodium 133 (L) 135 - 145 mmol/L   Potassium 4.5 3.5 - 5.1 mmol/L    Comment: HEMOLYSIS AT THIS LEVEL MAY AFFECT RESULT   Chloride 102 98 - 111 mmol/L   CO2 19 (L) 22 - 32 mmol/L   Glucose, Bld 106 (H) 70 - 99 mg/dL    Comment: Glucose reference range applies only to samples taken after fasting for at least 8 hours.   BUN 18 6 - 20 mg/dL   Creatinine, Ser 9.12 0.44 - 1.00 mg/dL   Calcium  8.7 (L) 8.9 - 10.3 mg/dL   Total Protein 8.4 (H) 6.5 - 8.1 g/dL   Albumin 4.1 3.5 - 5.0 g/dL   AST 26 15 - 41 U/L    Comment: HEMOLYSIS AT THIS LEVEL MAY AFFECT RESULT   ALT 20 0 -  44 U/L    Comment: HEMOLYSIS AT THIS LEVEL MAY AFFECT RESULT   Alkaline Phosphatase 63 38 - 126 U/L   Total Bilirubin 1.8 (H) 0.0 - 1.2 mg/dL    Comment: HEMOLYSIS AT THIS LEVEL MAY AFFECT RESULT   GFR, Estimated >  60 >60 mL/min    Comment: (NOTE) Calculated using the CKD-EPI Creatinine Equation (2021)    Anion gap 12 5 - 15    Comment: Performed at [facility], [address] [place], [place] [address]  Lipase, blood     Status: None   Collection Time: 08/07/23  9:56 PM  Result Value Ref Range   Lipase 28 11 - 51 U/L    Comment: Performed at [facility], [address], [place], [place] [address]  CBC with Diff     Status: Abnormal   Collection Time: 08/07/23  9:56 PM  Result Value Ref Range   WBC 9.0 4.0 - 10.5 K/uL   RBC 4.82 3.87 - 5.11 MIL/uL   Hemoglobin 13.9 12.0 - 15.0 g/dL   HCT 57.6 63.9 - 53.9 %   MCV 87.8 80.0 - 100.0 fL   MCH 28.8 26.0 - 34.0 pg   MCHC 32.9 30.0 - 36.0 g/dL   RDW 86.2 88.4 - 84.4 %   Platelets 308 150 - 400 K/uL   nRBC 0.0 0.0 - 0.2 %   Neutrophils Relative % 89 %   Neutro Abs 7.9 (H) 1.7 - 7.7 K/uL   Lymphocytes Relative 7 %   Lymphs Abs 0.6 (L) 0.7 - 4.0 K/uL   Monocytes Relative 4 %   Monocytes Absolute 0.4 0.1 - 1.0 K/uL   Eosinophils Relative 0 %   Eosinophils Absolute 0.0 0.0 - 0.5 K/uL   Basophils Relative 0 %   Basophils Absolute 0.0 0.0 - 0.1 K/uL   Immature Granulocytes 0 %   Abs Immature Granulocytes 0.02 0.00 - 0.07 K/uL    Comment: Performed at [facility], [address] [place], [place] [address]  Urinalysis, Routine w reflex microscopic -Urine, Clean Catch     Status: Abnormal   Collection Time: 08/07/23  9:56 PM  Result Value Ref Range   Color, Urine YELLOW YELLOW   APPearance HAZY (A) CLEAR   Specific Gravity, Urine 1.028 1.005 - 1.030   pH 5.0 5.0 - 8.0   Glucose, UA NEGATIVE NEGATIVE mg/dL   Hgb urine dipstick NEGATIVE NEGATIVE   Bilirubin Urine NEGATIVE  NEGATIVE   Ketones, ur NEGATIVE NEGATIVE mg/dL   Protein, ur NEGATIVE NEGATIVE mg/dL   Nitrite NEGATIVE NEGATIVE   Leukocytes,Ua NEGATIVE NEGATIVE    Comment: Performed at [facility], [address] [place], [place] [address]  hCG, serum, qualitative     Status: None   Collection Time: 08/07/23  9:56 PM  Result Value Ref Range   Preg, Serum NEGATIVE NEGATIVE    Comment:        THE SENSITIVITY OF THIS METHODOLOGY IS >10 mIU/mL. Performed at [facility], [address], [place], [place] [address]     Imaging / Studies: US  Abdomen Limited RUQ (LIVER/GB) Result Date: 08/08/2023 CLINICAL DATA:  Right upper quadrant pain. EXAM: ULTRASOUND ABDOMEN LIMITED RIGHT UPPER QUADRANT COMPARISON:  None Available. FINDINGS: Gallbladder: A 3.3 cm shadowing echogenic gallstone is seen within the gallbladder lumen. The gallbladder wall is limited in evaluation. A positive sonographic Beverley sign is noted by sonographer. Common bile duct: Diameter: 3.0 mm Liver: No focal lesion identified. Diffusely increased echogenicity of the liver parenchyma is noted. Portal vein is patent on color Doppler imaging with normal direction of blood flow towards the liver. Other: None. IMPRESSION: 1. Cholelithiasis, in the setting of a positive sonographic Murphy sign which is suggestive of acute  cholecystitis. 2. Hepatic steatosis. Electronically Signed   By: Suzen Dials M.D.   On: 08/08/2023 01:20    Medications / Allergies: per chart  Antibiotics: Anti-infectives (From admission, onward)    Start     Dose/Rate Route Frequency Ordered Stop   08/09/23 0200  cefTRIAXone  (ROCEPHIN ) 2 g in sodium chloride  0.9 % 100 mL IVPB        2 g 200 mL/hr over 30 Minutes Intravenous Every 24 hours 08/08/23 0241     08/08/23 0300  metroNIDAZOLE  (FLAGYL ) IVPB 500 mg        500 mg 100 mL/hr over 60 Minutes Intravenous Every 12 hours 08/08/23 0244     08/08/23 0200  cefTRIAXone  (ROCEPHIN ) 2 g  in sodium chloride  0.9 % 100 mL IVPB        2 g 200 mL/hr over 30 Minutes Intravenous  Once 08/08/23 0149 08/08/23 0246   08/08/23 0200  metroNIDAZOLE  (FLAGYL ) IVPB 500 mg  Status:  Discontinued        500 mg 100 mL/hr over 60 Minutes Intravenous  Once 08/08/23 0149 08/08/23 0244         Note: Portions of this report may have been transcribed using voice recognition software. Every effort was made to ensure accuracy; however, inadvertent computerized transcription errors may be present.   Any transcriptional errors that result from this process are unintentional.    Elspeth KYM Schultze, MD, FACS, MASCRS Esophageal, Gastrointestinal & Colorectal Surgery Robotic and Minimally Invasive Surgery  [facility] [address] [place], [place] [address] [phone] Fax [phone] Main  CONTACT INFORMATION: Weekday (9AM-5PM): Call [facility] main office at [phone] Weeknight (5PM-9AM) or Weekend/Holiday: Check EPIC Web Links tab & use AMION (password  [email or web address]) for General Surgery [facility] coverage  Please, DO NOT use SecureChat  (it is not reliable communication to reach operating surgeons & will lead to a delay in care).   Epic staff messaging available for outptient concerns needing 1-2 business day response.       08/08/2023  2:59 AM

## 2023-08-08 NOTE — Op Note (Signed)
 Procedure Note  Pre-operative Diagnosis:  chronic cholecystitis, cholelithiasis, biliary colic  Post-operative Diagnosis:  same  Surgeon:  Krystal Spinner, MD  Assistant:  none   Procedure:  Laparoscopic cholecystectomy with intra-operative cholangiography  Anesthesia:  General  Estimated Blood Loss:  minimal  Drains: none         Specimen: gallbladder to pathology  Indications: Patient is a 43 year old female with known gallstones who presents to the emergency department with abdominal pain.  Patient has had previous emergency room visits for biliary colic.  Laboratory studies showed a mildly elevated total bilirubin of 1.8.  Patient is now brought to the operating room for cholecystectomy for chronic cholecystitis, cholelithiasis, and intermittent biliary colic.  Cholangiography will be performed due to the transiently elevated liver function test.  Procedure description: The patient was seen in the pre-op holding area. The risks, benefits, complications, treatment options, and expected outcomes were previously discussed with the patient. The patient agreed with the proposed plan and has signed the informed consent form.  The patient was transported to operating room #1 at the [facility]. The patient was placed in the supine position on the operating room table. Following induction of general anesthesia, the abdomen was prepped and draped in the usual aseptic fashion.  An incision was made in the skin near the umbilicus. The midline fascia was incised and the peritoneal cavity was entered and a Hasson cannula was introduced under direct vision. The cannula was secured with a 0-Vicryl pursestring suture. Pneumoperitoneum was established with carbon dioxide. Additional cannulae were introduced under direct vision along the right costal margin in the midline, mid-clavicular line, and anterior axillary line.   The gallbladder was identified and the fundus grasped and retracted  cephalad. Adhesions were taken down bluntly and the electrocautery was utilized as needed, taking care not to involve any adjacent structures. The infundibulum was grasped and retracted laterally, exposing the peritoneum overlying the triangle of Calot. The peritoneum was incised and structures exposed with blunt dissection. The cystic duct was clearly identified, bluntly dissected circumferentially, and clipped at the neck of the gallbladder.  An incision was made in the cystic duct and the cholangiogram catheter introduced. The catheter was secured using an ligaclip.  Real-time cholangiography was performed using C-arm fluoroscopy.  There was rapid filling of a normal caliber common bile duct.  There was reflux of contrast into the left and right hepatic ductal systems.  There was free flow distally into the duodenum without filling defect or obstruction.  The catheter was removed from the peritoneal cavity.  The cystic duct was then ligated with ligaclips and divided. The cystic artery was identified, dissected circumferentially, ligated with ligaclips, and divided.  The gallbladder was dissected away from the gallbladder bed using the electrocautery for hemostasis. The gallbladder was completely removed from the liver and placed into an endocatch bag. The gallbladder was removed in the endocatch bag through the umbilical port site and submitted to pathology for review.  The right upper quadrant was irrigated and the gallbladder bed was inspected. Hemostasis was achieved with the electrocautery.  Cannulae were removed under direct vision and good hemostasis was noted. Pneumoperitoneum was released and the majority of the carbon dioxide evacuated. The umbilical wound was irrigated and the fascia was then closed with the pursestring suture.  Local anesthetic was infiltrated at all port sites. Skin incisions were closed with 4-0 Monocril subcuticular sutures and Dermabond was applied.  Instrument,  sponge, and needle counts were correct at the conclusion  of the case.  The patient was awakened from anesthesia and brought to the recovery room in stable condition.  The patient tolerated the procedure well.   Krystal Spinner, MD [facility] Surgery Office: [phone]

## 2023-08-08 NOTE — Anesthesia Postprocedure Evaluation (Signed)
 Anesthesia Post Note  Patient: Kathleen Montoya  Procedure(s) Performed: LAPAROSCOPIC CHOLECYSTECTOMY WITH CHOLANGIOGRAM     Patient location during evaluation: PACU Anesthesia Type: General Level of consciousness: awake and alert Pain management: pain level controlled Vital Signs Assessment: post-procedure vital signs reviewed and stable Respiratory status: spontaneous breathing, nonlabored ventilation and respiratory function stable Cardiovascular status: blood pressure returned to baseline Postop Assessment: no apparent nausea or vomiting Anesthetic complications: no   No notable events documented.  Last Vitals:  Vitals:   08/08/23 1350 08/08/23 1715  BP: 116/83 128/63  Pulse: 78 77  Resp: 16 14  Temp: 36.5 C 36.7 C  SpO2: 98% 100%    Last Pain:  Vitals:   08/08/23 1715  TempSrc:   PainSc: 0-No pain                 Vertell Row

## 2023-08-09 ENCOUNTER — Other Ambulatory Visit: Payer: Self-pay

## 2023-08-09 ENCOUNTER — Encounter (HOSPITAL_COMMUNITY): Payer: Self-pay | Admitting: Surgery

## 2023-08-09 DIAGNOSIS — K812 Acute cholecystitis with chronic cholecystitis: Secondary | ICD-10-CM | POA: Diagnosis not present

## 2023-08-09 DIAGNOSIS — I5042 Chronic combined systolic (congestive) and diastolic (congestive) heart failure: Secondary | ICD-10-CM | POA: Diagnosis not present

## 2023-08-09 LAB — COMPREHENSIVE METABOLIC PANEL
ALT: 26 U/L (ref 0–44)
AST: 29 U/L (ref 15–41)
Albumin: 3.3 g/dL — ABNORMAL LOW (ref 3.5–5.0)
Alkaline Phosphatase: 53 U/L (ref 38–126)
Anion gap: 11 (ref 5–15)
BUN: 15 mg/dL (ref 6–20)
CO2: 22 mmol/L (ref 22–32)
Calcium: 8.3 mg/dL — ABNORMAL LOW (ref 8.9–10.3)
Chloride: 103 mmol/L (ref 98–111)
Creatinine, Ser: 0.88 mg/dL (ref 0.44–1.00)
GFR, Estimated: >60 mL/min (ref >60)
Glucose, Bld: 117 mg/dL — ABNORMAL HIGH (ref 70–99)
Potassium: 4.3 mmol/L (ref 3.5–5.1)
Sodium: 136 mmol/L (ref 135–145)
Total Bilirubin: 0.7 mg/dL (ref 0.0–1.2)
Total Protein: 7 g/dL (ref 6.5–8.1)

## 2023-08-09 MED ORDER — OXYCODONE HCL 5 MG PO TABS: 5.0000 mg | ORAL_TABLET | Freq: Four times a day (QID) | ORAL | 0 refills | Status: AC | PRN

## 2023-08-09 MED ORDER — ENOXAPARIN SODIUM 40 MG/0.4ML IJ SOSY
40.0000 mg | PREFILLED_SYRINGE | INTRAMUSCULAR | Status: DC
  Administered 2023-08-09: 40 mg via SUBCUTANEOUS
  Filled 2023-08-09: qty 0.4

## 2023-08-09 NOTE — Progress Notes (Signed)
 Rounding Note    Patient Name: Kathleen Montoya Date of Encounter: 08/09/2023  [facility] Cardiologist: Oneil Parchment, MD   Subjective   43 year old female with a history of congestive heart failure in the past.  She is followed by Dr. Parchment.  She was admitted with symptoms of cholecystitis.  She underwent laparoscopic cholecystectomy and is doing quite well.  She was able to eat breakfast this morning.  She is ambulated with the assistance of a of a nurse.  She has not had any cardiology issues.  Inpatient Medications    Scheduled Meds:  enoxaparin  (LOVENOX ) injection  40 mg Subcutaneous Q24H   metoprolol  succinate  50 mg Oral Daily   pantoprazole   40 mg Oral BID AC   polycarbophil  625 mg Oral BID   Continuous Infusions:  sodium chloride  50 mL/hr at 08/08/23 1941   cefTRIAXone  (ROCEPHIN )  IV 2 g (08/09/23 0051)   lactated ringers      metronidazole  500 mg (08/09/23 0455)   PRN Meds: acetaminophen , acetaminophen  **OR** acetaminophen , acetaminophen , alum & mag hydroxide-simeth, bisacodyl , bismuth  subsalicylate, diphenhydrAMINE , HYDROmorphone  (DILAUDID ) injection, lactated ringers , magic mouthwash, menthol -cetylpyridinium, methocarbamol  (ROBAXIN ) injection, metoprolol  tartrate, naphazoline-glycerin , ondansetron  (ZOFRAN ) IV, oxyCODONE , oxyCODONE , phenol, prochlorperazine , sodium chloride , traMADol    Vital Signs    Vitals:   08/08/23 1838 08/08/23 2256 08/09/23 0300 08/09/23 0617  BP: 112/70 (!) 112/58 (!) 115/59 102/75  Pulse: 63 71 67 64  Resp:  17 18 (!) 22  Temp: 97.6 F (36.4 C) (!) 97.4 F (36.3 C) 98.4 F (36.9 C) 98.6 F (37 C)  TempSrc: Oral Oral Oral Oral  SpO2: 97% 99% 96% 96%  Weight:      Height:        Intake/Output Summary (Last 24 hours) at 08/09/2023 0847 Last data filed at 08/09/2023 0700 Gross per 24 hour  Intake 1798.29 ml  Output 450 ml  Net 1348.29 ml      08/08/2023    1:50 PM 06/06/2023   12:38 PM 04/01/2023    4:18 PM  Last 3  Weights  Weight (lbs) 375 lb 374 lb 12.5 oz 375 lb  Weight (kg) 170.099 kg 170 kg 170.099 kg      Telemetry    NSR  - Personally Reviewed  ECG     - Personally Reviewed  Physical Exam   GEN: obese, young female, NAD   Neck: No JVD Cardiac: RRR, no murmurs, rubs, or gallops.  Respiratory: Clear to auscultation bilaterally. GI: soft, + BS  appropriately tender along incisions   MS: No edema; No deformity. Neuro:  Nonfocal  Psych: Normal affect   Labs    High Sensitivity Troponin:  No results for input(s): TROPONINIHS in the last 720 hours.   Chemistry Recent Labs  Lab 08/07/23 2156 08/08/23 1916 08/09/23 0447  NA 133*  --  136  K 4.5 3.5 4.3  CL 102  --  103  CO2 19*  --  22  GLUCOSE 106*  --  117*  BUN 18  --  15  CREATININE 0.87 0.90 0.88  CALCIUM  8.7*  --  8.3*  PROT 8.4* 7.0 7.0  ALBUMIN 4.1 3.3* 3.3*  AST 26 25 29   ALT 20 24 26   ALKPHOS 63 51 53  BILITOT 1.8* 0.8 0.7  GFRNONAA >60 >60 >60  ANIONGAP 12  --  11    Lipids No results for input(s): CHOL, TRIG, HDL, LABVLDL, LDLCALC, CHOLHDL in the last 168 hours.  Hematology Recent Labs  Lab 08/07/23 2156 08/08/23 1916  WBC 9.0 6.9  RBC 4.82 4.20  HGB 13.9 12.2  HCT 42.3 39.0  MCV 87.8 92.9  MCH 28.8 29.0  MCHC 32.9 31.3  RDW 13.7 13.9  PLT 308 226   Thyroid  No results for input(s): TSH, FREET4 in the last 168 hours.  BNPNo results for input(s): BNP, PROBNP in the last 168 hours.  DDimer No results for input(s): DDIMER in the last 168 hours.   Radiology    DG Cholangiogram Operative Result Date: 08/08/2023 CLINICAL DATA:  Cholecystectomy. EXAM: INTRAOPERATIVE CHOLANGIOGRAM TECHNIQUE: Cholangiographic images from the C-arm fluoroscopic device were submitted for interpretation post-operatively. Please see the procedural report for the amount of contrast and the fluoroscopy time utilized. FLUOROSCOPY: Radiation Exposure Index (as provided by the fluoroscopic device): 16.6 mGy  Kerma COMPARISON:  Right upper quadrant ultrasound on 08/08/2023 FINDINGS: Intraoperative imaging with a C-arm demonstrates normal opacified cystic duct stump, common bile duct and visualized intrahepatic ducts. No evidence of biliary dilatation, stricture, filling defect or contrast extravasation. Contrast enters the duodenum normally. IMPRESSION: Normal intraoperative cholangiogram. Electronically Signed   By: Marcey Moan M.D.   On: 08/08/2023 17:06   ECHOCARDIOGRAM COMPLETE Result Date: 08/08/2023    ECHOCARDIOGRAM REPORT   Patient Name:   Kathleen Montoya Date of Exam: 08/08/2023 Medical Rec #:  [id number]           Height:       69.0 in Accession #:    [id number]          Weight:       374.8 lb Date of Birth:  [birth year]           BSA:          2.698 m Patient Age:    42 years            BP:           128/101 mmHg Patient Gender: F                   HR:           65 bpm. Exam Location:  Inpatient Procedure: 2D Echo, Color Doppler, Cardiac Doppler and Intracardiac            Opacification Agent Indications:    Pre-operative cardiovascular examination Z01.810                 CHF-Acute Systolic I50.21  History:        Patient has no prior history of Echocardiogram examinations.                 CHF, Arrythmias:Tachycardia; Risk Factors:Hypertension.  Sonographer:    Lanell Maduro Referring Phys: [id number] ANGELA NICOLE DUKE  Sonographer Comments: No subcostal window and patient is obese. Image acquisition challenging due to patient body habitus. IMPRESSIONS  1. Left ventricular ejection fraction, by estimation, is 55%. The left ventricle has normal function. The left ventricle has no regional wall motion abnormalities. There is mild concentric left ventricular hypertrophy. Left ventricular diastolic parameters were normal.  2. Right ventricular systolic function is normal. The right ventricular size is normal.  3. The mitral valve is normal in structure. No evidence of mitral valve regurgitation. No evidence  of mitral stenosis.  4. The aortic valve is normal in structure. Aortic valve regurgitation is not visualized. No aortic stenosis is present.  5. The inferior vena cava is normal in size with greater than 50% respiratory variability, suggesting right atrial  pressure of 3 mmHg. FINDINGS  Left Ventricle: Left ventricular ejection fraction, by estimation, is 55%. The left ventricle has normal function. The left ventricle has no regional wall motion abnormalities. Definity  contrast agent was given IV to delineate the left ventricular endocardial borders. The left ventricular internal cavity size was normal in size. There is mild concentric left ventricular hypertrophy. Left ventricular diastolic parameters were normal. Right Ventricle: The right ventricular size is normal. No increase in right ventricular wall thickness. Right ventricular systolic function is normal. Left Atrium: Left atrial size was normal in size. Right Atrium: Right atrial size was normal in size. Pericardium: There is no evidence of pericardial effusion. Mitral Valve: The mitral valve is normal in structure. No evidence of mitral valve regurgitation. No evidence of mitral valve stenosis. Tricuspid Valve: The tricuspid valve is normal in structure. Tricuspid valve regurgitation is trivial. No evidence of tricuspid stenosis. Aortic Valve: The aortic valve is normal in structure. Aortic valve regurgitation is not visualized. No aortic stenosis is present. Pulmonic Valve: The pulmonic valve was normal in structure. Pulmonic valve regurgitation is trivial. No evidence of pulmonic stenosis. Aorta: The aortic root is normal in size and structure. Venous: The inferior vena cava is normal in size with greater than 50% respiratory variability, suggesting right atrial pressure of 3 mmHg. IAS/Shunts: No atrial level shunt detected by color flow Doppler.  LEFT VENTRICLE PLAX 2D LVIDd:         3.80 cm      Diastology LVIDs:         2.90 cm      LV e' medial:     10.70 cm/s LV PW:         1.20 cm      LV E/e' medial:  6.6 LV IVS:        1.30 cm      LV e' lateral:   15.80 cm/s LVOT diam:     2.20 cm      LV E/e' lateral: 4.5 LV SV:         76 LV SV Index:   28 LVOT Area:     3.80 cm  LV Volumes (MOD) LV vol d, MOD A2C: 131.0 ml LV vol d, MOD A4C: 154.0 ml LV vol s, MOD A2C: 57.3 ml LV vol s, MOD A4C: 41.2 ml LV SV MOD A2C:     73.7 ml LV SV MOD A4C:     154.0 ml LV SV MOD BP:      92.1 ml RIGHT VENTRICLE RV Basal diam:  3.40 cm RV S prime:     8.37 cm/s TAPSE (M-mode): 3.4 cm LEFT ATRIUM             Index        RIGHT ATRIUM           Index LA diam:        3.90 cm 1.45 cm/m   RA Area:     16.20 cm LA Vol (A2C):   73.7 ml 27.32 ml/m  RA Volume:   42.70 ml  15.83 ml/m LA Vol (A4C):   58.9 ml 21.83 ml/m LA Biplane Vol: 67.7 ml 25.09 ml/m  AORTIC VALVE LVOT Vmax:   90.90 cm/s LVOT Vmean:  62.500 cm/s LVOT VTI:    0.199 m  AORTA Ao Root diam: 3.30 cm Ao Asc diam:  3.70 cm MITRAL VALVE               TRICUSPID VALVE MV Area (  PHT): 3.91 cm    TR Peak grad:   14.3 mmHg MV Decel Time: 194 msec    TR Vmax:        189.00 cm/s MV E velocity: 71.00 cm/s MV A velocity: 45.90 cm/s  SHUNTS MV E/A ratio:  1.55        Systemic VTI:  0.20 m                            Systemic Diam: 2.20 cm Toribio Fuel MD Electronically signed by Toribio Fuel MD Signature Date/Time: 08/08/2023/10:28:29 AM    Final    US  Abdomen Limited RUQ (LIVER/GB) Result Date: 08/08/2023 CLINICAL DATA:  Right upper quadrant pain. EXAM: ULTRASOUND ABDOMEN LIMITED RIGHT UPPER QUADRANT COMPARISON:  None Available. FINDINGS: Gallbladder: A 3.3 cm shadowing echogenic gallstone is seen within the gallbladder lumen. The gallbladder wall is limited in evaluation. A positive sonographic Beverley sign is noted by sonographer. Common bile duct: Diameter: 3.0 mm Liver: No focal lesion identified. Diffusely increased echogenicity of the liver parenchyma is noted. Portal vein is patent on color Doppler imaging with normal  direction of blood flow towards the liver. Other: None. IMPRESSION: 1. Cholelithiasis, in the setting of a positive sonographic Murphy sign which is suggestive of acute cholecystitis. 2. Hepatic steatosis. Electronically Signed   By: Suzen Dials M.D.   On: 08/08/2023 01:20    Cardiac Studies   \  Patient Profile     43 y.o. female with a history of premature ventricular contractions, PFO and mildly reduced LV function.  Her most recent echocardiogram shows an LVEF of 50 to 55%.  She presented with symptoms consistent with acute cholecystitis  Assessment & Plan    1.  History of chronic combined systolic and diastolic congestive heart failure: She did quite well with surgery.  She not had any symptoms.  She is breathing normally.  She is done quite well following her surgery.  We will sign off and be available on an as-needed basis.  2.  History of PVCs: She is currently in sinus rhythm.  Continue current medications.  She will follow-up with Dr. Jeffrie.  [facility] will sign off.   Medication Recommendations:  continue home meds upon discharge once she is eating and drinking normally  Other recommendations (labs, testing, etc):   Follow up as an outpatient:  with Dr. Jeffrie   For questions or updates, please contact [facility] Please consult [email or web address] for contact info under        Signed, Aleene Passe, MD  08/09/2023, 8:47 AM

## 2023-08-09 NOTE — Progress Notes (Signed)
 1 Day Post-Op   Subjective/Chief Complaint: Complains of soreness but feels she could go home today   Objective: Vital signs in last 24 hours: Temp:  [97.4 F (36.3 C)-98.7 F (37.1 C)] 98.7 F (37.1 C) (02/08 1040) Pulse Rate:  [57-78] 70 (02/08 1040) Resp:  [13-22] 16 (02/08 1040) BP: (100-128)/(49-83) 110/61 (02/08 1040) SpO2:  [91 %-100 %] 97 % (02/08 1040) Weight:  [170.1 kg] 170.1 kg (02/07 1350)    Intake/Output from previous day: 02/07 0701 - 02/08 0700 In: 1798.3 [P.O.:240; I.V.:1358.3; IV Piggyback:200] Out: 450 [Urine:400] Intake/Output this shift: Total I/O In: -  Out: 700 [Urine:700]  General appearance: alert and cooperative Resp: clear to auscultation bilaterally Cardio: regular rate and rhythm GI: soft, mild tenderness  Lab Results:  Recent Labs    08/07/23 2156 08/08/23 1916  WBC 9.0 6.9  HGB 13.9 12.2  HCT 42.3 39.0  PLT 308 226   BMET Recent Labs    08/07/23 2156 08/08/23 1916 08/09/23 0447  NA 133*  --  136  K 4.5 3.5 4.3  CL 102  --  103  CO2 19*  --  22  GLUCOSE 106*  --  117*  BUN 18  --  15  CREATININE 0.87 0.90 0.88  CALCIUM  8.7*  --  8.3*   PT/INR No results for input(s): LABPROT, INR in the last 72 hours. ABG No results for input(s): PHART, HCO3 in the last 72 hours.  Invalid input(s): PCO2, PO2  Studies/Results: DG Cholangiogram Operative Result Date: 08/08/2023 CLINICAL DATA:  Cholecystectomy. EXAM: INTRAOPERATIVE CHOLANGIOGRAM TECHNIQUE: Cholangiographic images from the C-arm fluoroscopic device were submitted for interpretation post-operatively. Please see the procedural report for the amount of contrast and the fluoroscopy time utilized. FLUOROSCOPY: Radiation Exposure Index (as provided by the fluoroscopic device): 16.6 mGy Kerma COMPARISON:  Right upper quadrant ultrasound on 08/08/2023 FINDINGS: Intraoperative imaging with a C-arm demonstrates normal opacified cystic duct stump, common bile duct and  visualized intrahepatic ducts. No evidence of biliary dilatation, stricture, filling defect or contrast extravasation. Contrast enters the duodenum normally. IMPRESSION: Normal intraoperative cholangiogram. Electronically Signed   By: Marcey Moan M.D.   On: 08/08/2023 17:06   ECHOCARDIOGRAM COMPLETE Result Date: 08/08/2023    ECHOCARDIOGRAM REPORT   Patient Name:   Kathleen Montoya Date of Exam: 08/08/2023 Medical Rec #:  [id number]           Height:       69.0 in Accession #:    [id number]          Weight:       374.8 lb Date of Birth:  May 29, 1981           BSA:          2.698 m Patient Age:    43 years            BP:           128/101 mmHg Patient Gender: F                   HR:           65 bpm. Exam Location:  Inpatient Procedure: 2D Echo, Color Doppler, Cardiac Doppler and Intracardiac            Opacification Agent Indications:    Pre-operative cardiovascular examination Z01.810                 CHF-Acute Systolic I50.21  History:        Patient  has no prior history of Echocardiogram examinations.                 CHF, Arrythmias:Tachycardia; Risk Factors:Hypertension.  Sonographer:    Lanell Maduro Referring Phys: [id number] ANGELA NICOLE DUKE  Sonographer Comments: No subcostal window and patient is obese. Image acquisition challenging due to patient body habitus. IMPRESSIONS  1. Left ventricular ejection fraction, by estimation, is 55%. The left ventricle has normal function. The left ventricle has no regional wall motion abnormalities. There is mild concentric left ventricular hypertrophy. Left ventricular diastolic parameters were normal.  2. Right ventricular systolic function is normal. The right ventricular size is normal.  3. The mitral valve is normal in structure. No evidence of mitral valve regurgitation. No evidence of mitral stenosis.  4. The aortic valve is normal in structure. Aortic valve regurgitation is not visualized. No aortic stenosis is present.  5. The inferior vena cava is normal  in size with greater than 50% respiratory variability, suggesting right atrial pressure of 3 mmHg. FINDINGS  Left Ventricle: Left ventricular ejection fraction, by estimation, is 55%. The left ventricle has normal function. The left ventricle has no regional wall motion abnormalities. Definity  contrast agent was given IV to delineate the left ventricular endocardial borders. The left ventricular internal cavity size was normal in size. There is mild concentric left ventricular hypertrophy. Left ventricular diastolic parameters were normal. Right Ventricle: The right ventricular size is normal. No increase in right ventricular wall thickness. Right ventricular systolic function is normal. Left Atrium: Left atrial size was normal in size. Right Atrium: Right atrial size was normal in size. Pericardium: There is no evidence of pericardial effusion. Mitral Valve: The mitral valve is normal in structure. No evidence of mitral valve regurgitation. No evidence of mitral valve stenosis. Tricuspid Valve: The tricuspid valve is normal in structure. Tricuspid valve regurgitation is trivial. No evidence of tricuspid stenosis. Aortic Valve: The aortic valve is normal in structure. Aortic valve regurgitation is not visualized. No aortic stenosis is present. Pulmonic Valve: The pulmonic valve was normal in structure. Pulmonic valve regurgitation is trivial. No evidence of pulmonic stenosis. Aorta: The aortic root is normal in size and structure. Venous: The inferior vena cava is normal in size with greater than 50% respiratory variability, suggesting right atrial pressure of 3 mmHg. IAS/Shunts: No atrial level shunt detected by color flow Doppler.  LEFT VENTRICLE PLAX 2D LVIDd:         3.80 cm      Diastology LVIDs:         2.90 cm      LV e' medial:    10.70 cm/s LV PW:         1.20 cm      LV E/e' medial:  6.6 LV IVS:        1.30 cm      LV e' lateral:   15.80 cm/s LVOT diam:     2.20 cm      LV E/e' lateral: 4.5 LV SV:          76 LV SV Index:   28 LVOT Area:     3.80 cm  LV Volumes (MOD) LV vol d, MOD A2C: 131.0 ml LV vol d, MOD A4C: 154.0 ml LV vol s, MOD A2C: 57.3 ml LV vol s, MOD A4C: 41.2 ml LV SV MOD A2C:     73.7 ml LV SV MOD A4C:     154.0 ml LV SV MOD BP:  92.1 ml RIGHT VENTRICLE RV Basal diam:  3.40 cm RV S prime:     8.37 cm/s TAPSE (M-mode): 3.4 cm LEFT ATRIUM             Index        RIGHT ATRIUM           Index LA diam:        3.90 cm 1.45 cm/m   RA Area:     16.20 cm LA Vol (A2C):   73.7 ml 27.32 ml/m  RA Volume:   42.70 ml  15.83 ml/m LA Vol (A4C):   58.9 ml 21.83 ml/m LA Biplane Vol: 67.7 ml 25.09 ml/m  AORTIC VALVE LVOT Vmax:   90.90 cm/s LVOT Vmean:  62.500 cm/s LVOT VTI:    0.199 m  AORTA Ao Root diam: 3.30 cm Ao Asc diam:  3.70 cm MITRAL VALVE               TRICUSPID VALVE MV Area (PHT): 3.91 cm    TR Peak grad:   14.3 mmHg MV Decel Time: 194 msec    TR Vmax:        189.00 cm/s MV E velocity: 71.00 cm/s MV A velocity: 45.90 cm/s  SHUNTS MV E/A ratio:  1.55        Systemic VTI:  0.20 m                            Systemic Diam: 2.20 cm Toribio Fuel MD Electronically signed by Toribio Fuel MD Signature Date/Time: 08/08/2023/10:28:29 AM    Final    US  Abdomen Limited RUQ (LIVER/GB) Result Date: 08/08/2023 CLINICAL DATA:  Right upper quadrant pain. EXAM: ULTRASOUND ABDOMEN LIMITED RIGHT UPPER QUADRANT COMPARISON:  None Available. FINDINGS: Gallbladder: A 3.3 cm shadowing echogenic gallstone is seen within the gallbladder lumen. The gallbladder wall is limited in evaluation. A positive sonographic Beverley sign is noted by sonographer. Common bile duct: Diameter: 3.0 mm Liver: No focal lesion identified. Diffusely increased echogenicity of the liver parenchyma is noted. Portal vein is patent on color Doppler imaging with normal direction of blood flow towards the liver. Other: None. IMPRESSION: 1. Cholelithiasis, in the setting of a positive sonographic Murphy sign which is suggestive of acute  cholecystitis. 2. Hepatic steatosis. Electronically Signed   By: Suzen Dials M.D.   On: 08/08/2023 01:20    Anti-infectives: Anti-infectives (From admission, onward)    Start     Dose/Rate Route Frequency Ordered Stop   08/09/23 0200  cefTRIAXone  (ROCEPHIN ) 2 g in sodium chloride  0.9 % 100 mL IVPB        2 g 200 mL/hr over 30 Minutes Intravenous Every 24 hours 08/08/23 0241     08/08/23 0300  metroNIDAZOLE  (FLAGYL ) IVPB 500 mg        500 mg 100 mL/hr over 60 Minutes Intravenous Every 12 hours 08/08/23 0244     08/08/23 0200  cefTRIAXone  (ROCEPHIN ) 2 g in sodium chloride  0.9 % 100 mL IVPB        2 g 200 mL/hr over 30 Minutes Intravenous  Once 08/08/23 0149 08/08/23 0534   08/08/23 0200  metroNIDAZOLE  (FLAGYL ) IVPB 500 mg  Status:  Discontinued        500 mg 100 mL/hr over 60 Minutes Intravenous  Once 08/08/23 0149 08/08/23 0244       Assessment/Plan: s/p Procedure(s): LAPAROSCOPIC CHOLECYSTECTOMY WITH CHOLANGIOGRAM (N/A) Advance diet If pain improves then she is  ok for d/c today F/u in 2 weeks with surgery  LOS: 1 day    Deward Null III 08/09/2023

## 2023-08-09 NOTE — Discharge Summary (Signed)
 Physician Discharge Summary  Kathleen Montoya:[id number] DOB: [birth year] DOA: 08/07/2023  PCP: Teresa Channel, MD  Admit date: 08/07/2023 Discharge date: 08/09/2023  Admitted From: Home Disposition:  Home  Discharge Condition:Stable CODE STATUS:FULL Diet recommendation: Heart Healthy   Brief/Interim Summary: Patient is a 43 year old female with history of morbid obesity, PFO, combined systolic/diastolic CHF who presented to the ED with complaint of nausea, upper abdominal discomfort, vomiting.  History of cholelithiasis.  Also reported fever.  On presentation, she was hemodynamically stable, afebrile.  Lab work showed T. bili of 1.8.  Right upper quadrant ultrasound showed cholelithiasis with positive Murphy sign.  General surgery consulted, status post laparoscopic cholecystectomy.  General surgery cleared for discharge.  Medically stable for discharge home today.  Following problems were addressed during the hospitalization:  Acute cholecystitis/cholelithiasis: Presented with abdomen pain, nausea, vomiting.  Liver enzymes stable.   General surgery consulted, s/p lap cholecystectomy .  General surgery cleared for discharge.  She will follow-up with general surgery as an outpatient.   History of combined systolic/diastolic CHF/PFO: Follows with cardiology.  On metoprolol , aspirin at home.  Currently appears euvolemic.  Echo done here showed EF of 55%, no wall motion abnormality, normal left ventricular diastolic parameters   Morbid obesity/likely undiagnosed OSA/OHS: BMI of 55.3 .  Discharge Diagnoses:  Principal Problem:   Acute on chronic cholecystitis Active Problems:   Morbid obesity with BMI of 50.0-59.9, adult (HCC)   Palpitations   Gastroesophageal reflux disease without esophagitis   Insomnia   Hypercholesteremia   Tachycardia   LFT elevation   Epigastric hernia   Multiple drug allergies   Chronic combined systolic and diastolic congestive heart failure (HCC)   Hepatic  steatosis   Serum total bilirubin elevated   Choledocholithiasis with acute cholecystitis    Discharge Instructions  Discharge Instructions     Diet - low sodium heart healthy   Complete by: As directed    Discharge instructions   Complete by: As directed    1)Follow up with general surgery as an outpatient.  Name and number the provider has been attached 2)Continue taking your medications as instructed   Increase activity slowly   Complete by: As directed       Allergies as of 08/09/2023       Reactions   Clindamycin Rash   Topiramate Other (See Comments)   word finding difficulty   Amitriptyline Other (See Comments)   Not effective: Lack of Therapeutic effect   Clindamycin/lincomycin Rash   Erythromycin Other (See Comments)   Abdominal pain   Loestrin [norethindrone Acet-ethinyl Est] Hives   Maxalt [rizatriptan Benzoate] Other (See Comments)   Not effective   Nortriptyline Hcl Other (See Comments)   Not effective: Lack Therapeutic Effect   Sumatriptan Other (See Comments)   Felt like acid was in her brain, if given IM   Zomig [zolmitriptan] Other (See Comments)   Not effective         Medication List     TAKE these medications    albuterol  108 (90 Base) MCG/ACT inhaler Commonly known as: VENTOLIN  HFA INHALE 1 PUFF BY MOUTH AS DIRECTED   metoprolol  succinate 50 MG 24 hr tablet Commonly known as: TOPROL -XL TAKE WITH OR IMMEDIATELY FOLLOWING A MEAL What changed: See the new instructions.   multivitamin capsule Take 1 capsule by mouth daily.   omeprazole 20 MG tablet Commonly known as: PRILOSEC OTC Take 20 mg by mouth every other day.   ondansetron  8 MG disintegrating tablet Commonly known  as: ZOFRAN -ODT Take 1 tablet (8 mg total) by mouth every 8 (eight) hours as needed for nausea or vomiting.   oxyCODONE  5 MG immediate release tablet Commonly known as: Roxicodone  Take 1 tablet (5 mg total) by mouth every 6 (six) hours as needed for severe pain  (pain score 7-10).   oxyCODONE -acetaminophen  5-325 MG tablet Commonly known as: PERCOCET/ROXICET Take 1 tablet by mouth every 6 (six) hours as needed for severe pain (pain score 7-10).   Ubrelvy 100 MG Tabs Generic drug: Ubrogepant Take 100 mg by mouth as needed (migraine).   Vitamin D 125 MCG (5000 UT) Caps Take 2 capsules by mouth 2 (two) times a week.        Follow-up Information     Maczis, Puja Gosai, PA-C. Go on 09/04/2023.   Specialty: General Surgery Why: 3/6 at 2:45pm. Please arrive 30 minutes early to complete check in, and bring photo ID and insurance card. Contact information: [address] [facility] [place] [address] [phone]                Allergies  Allergen Reactions   Clindamycin Rash   Topiramate Other (See Comments)    word finding difficulty   Amitriptyline Other (See Comments)    Not effective: Lack of Therapeutic effect   Clindamycin/Lincomycin Rash   Erythromycin Other (See Comments)    Abdominal pain   Loestrin [Norethindrone Acet-Ethinyl Est] Hives   Maxalt [Rizatriptan Benzoate] Other (See Comments)    Not effective   Nortriptyline Hcl Other (See Comments)    Not effective: Lack Therapeutic Effect   Sumatriptan Other (See Comments)    Felt like acid was in her brain, if given IM   Zomig [Zolmitriptan] Other (See Comments)    Not effective     Consultations: General surgery, cardiology   Procedures/Studies: DG Cholangiogram Operative Result Date: 08/08/2023 CLINICAL DATA:  Cholecystectomy. EXAM: INTRAOPERATIVE CHOLANGIOGRAM TECHNIQUE: Cholangiographic images from the C-arm fluoroscopic device were submitted for interpretation post-operatively. Please see the procedural report for the amount of contrast and the fluoroscopy time utilized. FLUOROSCOPY: Radiation Exposure Index (as provided by the fluoroscopic device): 16.6 mGy Kerma COMPARISON:  Right upper quadrant ultrasound on 08/08/2023  FINDINGS: Intraoperative imaging with a C-arm demonstrates normal opacified cystic duct stump, common bile duct and visualized intrahepatic ducts. No evidence of biliary dilatation, stricture, filling defect or contrast extravasation. Contrast enters the duodenum normally. IMPRESSION: Normal intraoperative cholangiogram. Electronically Signed   By: Marcey Moan M.D.   On: 08/08/2023 17:06   ECHOCARDIOGRAM COMPLETE Result Date: 08/08/2023    ECHOCARDIOGRAM REPORT   Patient Name:   Dereona A Norenberg Date of Exam: 08/08/2023 Medical Rec #:  [id number]           Height:       69.0 in Accession #:    [id number]          Weight:       374.8 lb Date of Birth:  March 15, 1981           BSA:          2.698 m Patient Age:    42 years            BP:           128/101 mmHg Patient Gender: F                   HR:           65 bpm. Exam  Location:  Inpatient Procedure: 2D Echo, Color Doppler, Cardiac Doppler and Intracardiac            Opacification Agent Indications:    Pre-operative cardiovascular examination Z01.810                 CHF-Acute Systolic I50.21  History:        Patient has no prior history of Echocardiogram examinations.                 CHF, Arrythmias:Tachycardia; Risk Factors:Hypertension.  Sonographer:    Lanell Maduro Referring Phys: [id number] ANGELA NICOLE DUKE  Sonographer Comments: No subcostal window and patient is obese. Image acquisition challenging due to patient body habitus. IMPRESSIONS  1. Left ventricular ejection fraction, by estimation, is 55%. The left ventricle has normal function. The left ventricle has no regional wall motion abnormalities. There is mild concentric left ventricular hypertrophy. Left ventricular diastolic parameters were normal.  2. Right ventricular systolic function is normal. The right ventricular size is normal.  3. The mitral valve is normal in structure. No evidence of mitral valve regurgitation. No evidence of mitral stenosis.  4. The aortic valve is normal in structure.  Aortic valve regurgitation is not visualized. No aortic stenosis is present.  5. The inferior vena cava is normal in size with greater than 50% respiratory variability, suggesting right atrial pressure of 3 mmHg. FINDINGS  Left Ventricle: Left ventricular ejection fraction, by estimation, is 55%. The left ventricle has normal function. The left ventricle has no regional wall motion abnormalities. Definity  contrast agent was given IV to delineate the left ventricular endocardial borders. The left ventricular internal cavity size was normal in size. There is mild concentric left ventricular hypertrophy. Left ventricular diastolic parameters were normal. Right Ventricle: The right ventricular size is normal. No increase in right ventricular wall thickness. Right ventricular systolic function is normal. Left Atrium: Left atrial size was normal in size. Right Atrium: Right atrial size was normal in size. Pericardium: There is no evidence of pericardial effusion. Mitral Valve: The mitral valve is normal in structure. No evidence of mitral valve regurgitation. No evidence of mitral valve stenosis. Tricuspid Valve: The tricuspid valve is normal in structure. Tricuspid valve regurgitation is trivial. No evidence of tricuspid stenosis. Aortic Valve: The aortic valve is normal in structure. Aortic valve regurgitation is not visualized. No aortic stenosis is present. Pulmonic Valve: The pulmonic valve was normal in structure. Pulmonic valve regurgitation is trivial. No evidence of pulmonic stenosis. Aorta: The aortic root is normal in size and structure. Venous: The inferior vena cava is normal in size with greater than 50% respiratory variability, suggesting right atrial pressure of 3 mmHg. IAS/Shunts: No atrial level shunt detected by color flow Doppler.  LEFT VENTRICLE PLAX 2D LVIDd:         3.80 cm      Diastology LVIDs:         2.90 cm      LV e' medial:    10.70 cm/s LV PW:         1.20 cm      LV E/e' medial:  6.6 LV  IVS:        1.30 cm      LV e' lateral:   15.80 cm/s LVOT diam:     2.20 cm      LV E/e' lateral: 4.5 LV SV:         76 LV SV Index:   28 LVOT Area:  3.80 cm  LV Volumes (MOD) LV vol d, MOD A2C: 131.0 ml LV vol d, MOD A4C: 154.0 ml LV vol s, MOD A2C: 57.3 ml LV vol s, MOD A4C: 41.2 ml LV SV MOD A2C:     73.7 ml LV SV MOD A4C:     154.0 ml LV SV MOD BP:      92.1 ml RIGHT VENTRICLE RV Basal diam:  3.40 cm RV S prime:     8.37 cm/s TAPSE (M-mode): 3.4 cm LEFT ATRIUM             Index        RIGHT ATRIUM           Index LA diam:        3.90 cm 1.45 cm/m   RA Area:     16.20 cm LA Vol (A2C):   73.7 ml 27.32 ml/m  RA Volume:   42.70 ml  15.83 ml/m LA Vol (A4C):   58.9 ml 21.83 ml/m LA Biplane Vol: 67.7 ml 25.09 ml/m  AORTIC VALVE LVOT Vmax:   90.90 cm/s LVOT Vmean:  62.500 cm/s LVOT VTI:    0.199 m  AORTA Ao Root diam: 3.30 cm Ao Asc diam:  3.70 cm MITRAL VALVE               TRICUSPID VALVE MV Area (PHT): 3.91 cm    TR Peak grad:   14.3 mmHg MV Decel Time: 194 msec    TR Vmax:        189.00 cm/s MV E velocity: 71.00 cm/s MV A velocity: 45.90 cm/s  SHUNTS MV E/A ratio:  1.55        Systemic VTI:  0.20 m                            Systemic Diam: 2.20 cm Toribio Fuel MD Electronically signed by Toribio Fuel MD Signature Date/Time: 08/08/2023/10:28:29 AM    Final    US  Abdomen Limited RUQ (LIVER/GB) Result Date: 08/08/2023 CLINICAL DATA:  Right upper quadrant pain. EXAM: ULTRASOUND ABDOMEN LIMITED RIGHT UPPER QUADRANT COMPARISON:  None Available. FINDINGS: Gallbladder: A 3.3 cm shadowing echogenic gallstone is seen within the gallbladder lumen. The gallbladder wall is limited in evaluation. A positive sonographic Beverley sign is noted by sonographer. Common bile duct: Diameter: 3.0 mm Liver: No focal lesion identified. Diffusely increased echogenicity of the liver parenchyma is noted. Portal vein is patent on color Doppler imaging with normal direction of blood flow towards the liver. Other: None.  IMPRESSION: 1. Cholelithiasis, in the setting of a positive sonographic Murphy sign which is suggestive of acute cholecystitis. 2. Hepatic steatosis. Electronically Signed   By: Suzen Dials M.D.   On: 08/08/2023 01:20      Subjective: Patient seen and examined at bedside today.  Hemodynamically stable.  She was having some abdominal discomfort but overall doing okay.  She urinated this morning, eager to go home today. Discharge Exam: Vitals:   08/09/23 0617 08/09/23 1040  BP: 102/75 110/61  Pulse: 64 70  Resp: (!) 22 16  Temp: 98.6 F (37 C) 98.7 F (37.1 C)  SpO2: 96% 97%   Vitals:   08/08/23 2256 08/09/23 0300 08/09/23 0617 08/09/23 1040  BP: (!) 112/58 (!) 115/59 102/75 110/61  Pulse: 71 67 64 70  Resp: 17 18 (!) 22 16  Temp: (!) 97.4 F (36.3 C) 98.4 F (36.9 C) 98.6 F (37 C) 98.7 F (  37.1 C)  TempSrc: Oral Oral Oral Oral  SpO2: 99% 96% 96% 97%  Weight:      Height:        General: Pt is alert, awake, not in acute distress Cardiovascular: RRR, S1/S2 +, no rubs, no gallops Respiratory: CTA bilaterally, no wheezing, no rhonchi Abdominal: Soft, NT, ND, bowel sounds +, lap chole surgical wounds Extremities: no edema, no cyanosis    The results of significant diagnostics from this hospitalization (including imaging, microbiology, ancillary and laboratory) are listed below for reference.     Microbiology: No results found for this or any previous visit (from the past 240 hours).   Labs: BNP (last 3 results) No results for input(s): BNP in the last 8760 hours. Basic Metabolic Panel: Recent Labs  Lab 08/07/23 2156 08/08/23 1916 08/09/23 0447  NA 133*  --  136  K 4.5 3.5 4.3  CL 102  --  103  CO2 19*  --  22  GLUCOSE 106*  --  117*  BUN 18  --  15  CREATININE 0.87 0.90 0.88  CALCIUM  8.7*  --  8.3*   Liver Function Tests: Recent Labs  Lab 08/07/23 2156 08/08/23 1916 08/09/23 0447  AST 26 25 29   ALT 20 24 26   ALKPHOS 63 51 53  BILITOT 1.8*  0.8 0.7  PROT 8.4* 7.0 7.0  ALBUMIN 4.1 3.3* 3.3*   Recent Labs  Lab 08/07/23 2156  LIPASE 28   No results for input(s): AMMONIA in the last 168 hours. CBC: Recent Labs  Lab 08/07/23 2156 08/08/23 1916  WBC 9.0 6.9  NEUTROABS 7.9*  --   HGB 13.9 12.2  HCT 42.3 39.0  MCV 87.8 92.9  PLT 308 226   Cardiac Enzymes: No results for input(s): CKTOTAL, CKMB, CKMBINDEX, TROPONINI in the last 168 hours. BNP: Invalid input(s): POCBNP CBG: No results for input(s): GLUCAP in the last 168 hours. D-Dimer No results for input(s): DDIMER in the last 72 hours. Hgb A1c No results for input(s): HGBA1C in the last 72 hours. Lipid Profile No results for input(s): CHOL, HDL, LDLCALC, TRIG, CHOLHDL, LDLDIRECT in the last 72 hours. Thyroid  function studies No results for input(s): TSH, T4TOTAL, T3FREE, THYROIDAB in the last 72 hours.  Invalid input(s): FREET3 Anemia work up No results for input(s): VITAMINB12, FOLATE, FERRITIN, TIBC, IRON, RETICCTPCT in the last 72 hours. Urinalysis    Component Value Date/Time   COLORURINE YELLOW 08/07/2023 2156   APPEARANCEUR HAZY (A) 08/07/2023 2156   LABSPEC 1.028 08/07/2023 2156   PHURINE 5.0 08/07/2023 2156   GLUCOSEU NEGATIVE 08/07/2023 2156   HGBUR NEGATIVE 08/07/2023 2156   BILIRUBINUR NEGATIVE 08/07/2023 2156   KETONESUR NEGATIVE 08/07/2023 2156   PROTEINUR NEGATIVE 08/07/2023 2156   UROBILINOGEN 0.2 01/31/2013 1559   NITRITE NEGATIVE 08/07/2023 2156   LEUKOCYTESUR NEGATIVE 08/07/2023 2156   Sepsis Labs Recent Labs  Lab 08/07/23 2156 08/08/23 1916  WBC 9.0 6.9   Microbiology No results found for this or any previous visit (from the past 240 hours).  Please note: You were cared for by a hospitalist during your hospital stay. Once you are discharged, your primary care physician will handle any further medical issues. Please note that NO REFILLS for any discharge medications will be  authorized once you are discharged, as it is imperative that you return to your primary care physician (or establish a relationship with a primary care physician if you do not have one) for your post hospital discharge needs so that they  can reassess your need for medications and monitor your lab values.    Time coordinating discharge: 40 minutes  SIGNED:   Ivonne Mustache, MD  [facility] 08/09/2023, 11:42 AM Pager [phone]  If 7PM-7AM, please contact night-coverage [email or web address] Password [email or web address]

## 2023-08-09 NOTE — Progress Notes (Signed)
 Pt discharged before TOC assessment completed.

## 2023-08-12 LAB — SURGICAL PATHOLOGY

## 2024-07-29 ENCOUNTER — Telehealth: Payer: Self-pay | Admitting: Cardiology

## 2024-07-29 NOTE — Telephone Encounter (Signed)
" °*  STAT* If patient is at the pharmacy, call can be transferred to refill team.   1. Which medications need to be refilled? (please list name of each medication and dose if known)   metoprolol  succinate (TOPROL -XL) 50 MG 24 hr tablet    2. Would you like to learn more about the convenience, safety, & potential cost savings by using the [facility]? no    3. Are you open to using the [facility] (Type [facility]. no   4. Which pharmacy/location (including street and city if local pharmacy) is medication to be sent to?   [id number] - [place], [place] - [address]    5. Do they need a 30 day or 90 day supply? 90 day    Pt is completely out.   "

## 2024-07-30 MED ORDER — METOPROLOL SUCCINATE ER 50 MG PO TB24: ORAL_TABLET | ORAL | 3 refills | Status: AC

## 2024-07-30 NOTE — Telephone Encounter (Signed)
 Rx sent to pharmacy

## 2024-10-29 ENCOUNTER — Ambulatory Visit: Admitting: Cardiology
